# Patient Record
Sex: Female | Born: 1980
Health system: Southern US, Community
[De-identification: ages and names within clinical notes are randomized; demographics above are authoritative.]

## PROBLEM LIST (undated history)

## (undated) DIAGNOSIS — E119 Type 2 diabetes mellitus without complications: Secondary | ICD-10-CM

## (undated) DIAGNOSIS — E785 Hyperlipidemia, unspecified: Secondary | ICD-10-CM

## (undated) DIAGNOSIS — O24419 Gestational diabetes mellitus in pregnancy, unspecified control: Secondary | ICD-10-CM

## (undated) HISTORY — PX: OTHER SURGICAL HISTORY: SHX169

## (undated) HISTORY — DX: Hyperlipidemia, unspecified: E78.5

## (undated) HISTORY — PX: NO PAST SURGERIES: SHX2092

## (undated) HISTORY — DX: Gestational diabetes mellitus in pregnancy, unspecified control: O24.419

## (undated) HISTORY — DX: Type 2 diabetes mellitus without complications: E11.9

---

## 2001-03-14 ENCOUNTER — Emergency Department (HOSPITAL_COMMUNITY): Admission: EM | Admit: 2001-03-14 | Discharge: 2001-03-15 | Payer: Self-pay | Admitting: Emergency Medicine

## 2001-07-18 ENCOUNTER — Ambulatory Visit (HOSPITAL_COMMUNITY): Admission: RE | Admit: 2001-07-18 | Discharge: 2001-07-18 | Payer: Self-pay | Admitting: Obstetrics

## 2001-07-18 ENCOUNTER — Encounter: Payer: Self-pay | Admitting: Obstetrics

## 2001-10-27 ENCOUNTER — Inpatient Hospital Stay (HOSPITAL_COMMUNITY): Admission: AD | Admit: 2001-10-27 | Discharge: 2001-10-29 | Payer: Self-pay | Admitting: Obstetrics

## 2001-10-27 ENCOUNTER — Encounter (INDEPENDENT_AMBULATORY_CARE_PROVIDER_SITE_OTHER): Payer: Self-pay

## 2003-03-04 ENCOUNTER — Encounter: Payer: Self-pay | Admitting: Infectious Diseases

## 2003-03-04 ENCOUNTER — Ambulatory Visit (HOSPITAL_COMMUNITY): Admission: RE | Admit: 2003-03-04 | Discharge: 2003-03-04 | Payer: Self-pay | Admitting: Infectious Diseases

## 2003-04-16 ENCOUNTER — Emergency Department (HOSPITAL_COMMUNITY): Admission: EM | Admit: 2003-04-16 | Discharge: 2003-04-16 | Payer: Self-pay | Admitting: Emergency Medicine

## 2003-04-19 ENCOUNTER — Emergency Department (HOSPITAL_COMMUNITY): Admission: EM | Admit: 2003-04-19 | Discharge: 2003-04-19 | Payer: Self-pay | Admitting: Emergency Medicine

## 2004-02-22 ENCOUNTER — Other Ambulatory Visit: Admission: RE | Admit: 2004-02-22 | Discharge: 2004-02-22 | Payer: Self-pay | Admitting: Obstetrics and Gynecology

## 2005-02-20 ENCOUNTER — Inpatient Hospital Stay (HOSPITAL_COMMUNITY): Admission: AD | Admit: 2005-02-20 | Discharge: 2005-02-21 | Payer: Self-pay | Admitting: Obstetrics and Gynecology

## 2005-12-11 ENCOUNTER — Emergency Department (HOSPITAL_COMMUNITY): Admission: EM | Admit: 2005-12-11 | Discharge: 2005-12-11 | Payer: Self-pay | Admitting: Family Medicine

## 2007-11-14 ENCOUNTER — Emergency Department (HOSPITAL_COMMUNITY): Admission: EM | Admit: 2007-11-14 | Discharge: 2007-11-14 | Payer: Self-pay | Admitting: Emergency Medicine

## 2007-12-04 ENCOUNTER — Encounter: Admission: RE | Admit: 2007-12-04 | Discharge: 2008-03-03 | Payer: Self-pay | Admitting: Family Medicine

## 2007-12-12 ENCOUNTER — Ambulatory Visit (HOSPITAL_COMMUNITY): Admission: RE | Admit: 2007-12-12 | Discharge: 2007-12-12 | Payer: Self-pay | Admitting: Family Medicine

## 2008-04-03 ENCOUNTER — Emergency Department (HOSPITAL_COMMUNITY): Admission: EM | Admit: 2008-04-03 | Discharge: 2008-04-03 | Payer: Self-pay | Admitting: Family Medicine

## 2008-06-26 ENCOUNTER — Ambulatory Visit (HOSPITAL_COMMUNITY): Admission: RE | Admit: 2008-06-26 | Discharge: 2008-06-26 | Payer: Self-pay | Admitting: Obstetrics

## 2008-06-26 ENCOUNTER — Encounter (INDEPENDENT_AMBULATORY_CARE_PROVIDER_SITE_OTHER): Payer: Self-pay | Admitting: Obstetrics

## 2009-01-20 ENCOUNTER — Encounter: Admission: RE | Admit: 2009-01-20 | Discharge: 2009-01-20 | Payer: Self-pay | Admitting: Obstetrics

## 2009-03-24 ENCOUNTER — Inpatient Hospital Stay (HOSPITAL_COMMUNITY): Admission: RE | Admit: 2009-03-24 | Discharge: 2009-03-25 | Payer: Self-pay | Admitting: Obstetrics

## 2009-03-24 ENCOUNTER — Encounter (INDEPENDENT_AMBULATORY_CARE_PROVIDER_SITE_OTHER): Payer: Self-pay | Admitting: Obstetrics

## 2009-11-15 ENCOUNTER — Emergency Department (HOSPITAL_COMMUNITY): Admission: EM | Admit: 2009-11-15 | Discharge: 2009-11-15 | Payer: Self-pay | Admitting: Family Medicine

## 2010-10-30 ENCOUNTER — Encounter: Payer: Self-pay | Admitting: Family Medicine

## 2010-12-11 ENCOUNTER — Inpatient Hospital Stay (HOSPITAL_COMMUNITY)
Admission: RE | Admit: 2010-12-11 | Discharge: 2010-12-11 | Disposition: A | Discharge status: Home / Self Care | Payer: 59 | Source: Ambulatory Visit | Attending: Obstetrics & Gynecology | Admitting: Obstetrics & Gynecology

## 2010-12-11 DIAGNOSIS — O4700 False labor before 37 completed weeks of gestation, unspecified trimester: Secondary | ICD-10-CM | POA: Insufficient documentation

## 2010-12-11 LAB — URINE MICROSCOPIC-ADD ON

## 2010-12-11 LAB — URINALYSIS, ROUTINE W REFLEX MICROSCOPIC
Ketones, ur: NEGATIVE mg/dL
Nitrite: NEGATIVE
Protein, ur: NEGATIVE mg/dL
pH: 7 (ref 5.0–8.0)

## 2010-12-13 LAB — URINE CULTURE: Culture  Setup Time: 201203041533

## 2011-01-16 LAB — GLUCOSE, CAPILLARY: Glucose-Capillary: 97 mg/dL (ref 70–99)

## 2011-01-16 LAB — CBC
MCHC: 34.1 g/dL (ref 30.0–36.0)
MCHC: 34.2 g/dL (ref 30.0–36.0)
MCV: 84.2 fL (ref 78.0–100.0)
MCV: 84.5 fL (ref 78.0–100.0)
Platelets: 125 10*3/uL — ABNORMAL LOW (ref 150–400)
Platelets: 135 10*3/uL — ABNORMAL LOW (ref 150–400)
RDW: 14.3 % (ref 11.5–15.5)

## 2011-01-16 LAB — GLUCOSE, RANDOM: Glucose, Bld: 77 mg/dL (ref 70–99)

## 2011-02-14 ENCOUNTER — Inpatient Hospital Stay (HOSPITAL_COMMUNITY)
Admission: AD | Admit: 2011-02-14 | Discharge: 2011-02-16 | DRG: 775 | Disposition: A | Discharge status: Home / Self Care | Payer: 59 | Source: Ambulatory Visit | Attending: Obstetrics and Gynecology | Admitting: Obstetrics and Gynecology

## 2011-02-14 DIAGNOSIS — D62 Acute posthemorrhagic anemia: Secondary | ICD-10-CM | POA: Diagnosis not present

## 2011-02-14 DIAGNOSIS — D689 Coagulation defect, unspecified: Principal | ICD-10-CM | POA: Diagnosis present

## 2011-02-14 DIAGNOSIS — O9903 Anemia complicating the puerperium: Secondary | ICD-10-CM | POA: Diagnosis not present

## 2011-02-14 DIAGNOSIS — D696 Thrombocytopenia, unspecified: Secondary | ICD-10-CM | POA: Diagnosis present

## 2011-02-14 LAB — CBC
MCH: 26.9 pg (ref 26.0–34.0)
MCHC: 34.3 g/dL (ref 30.0–36.0)
Platelets: 137 10*3/uL — ABNORMAL LOW (ref 150–400)
RDW: 14.5 % (ref 11.5–15.5)

## 2011-02-15 LAB — CBC
HCT: 32.5 % — ABNORMAL LOW (ref 36.0–46.0)
MCHC: 33.2 g/dL (ref 30.0–36.0)
RDW: 14.6 % (ref 11.5–15.5)

## 2011-02-15 LAB — RPR: RPR Ser Ql: NONREACTIVE

## 2011-02-16 ENCOUNTER — Inpatient Hospital Stay (HOSPITAL_COMMUNITY): Admission: AD | Admit: 2011-02-16 | Payer: Self-pay | Admitting: Obstetrics

## 2011-02-16 LAB — CBC
MCH: 26.1 pg (ref 26.0–34.0)
Platelets: 145 10*3/uL — ABNORMAL LOW (ref 150–400)
RBC: 3.22 MIL/uL — ABNORMAL LOW (ref 3.87–5.11)
WBC: 12.5 10*3/uL — ABNORMAL HIGH (ref 4.0–10.5)

## 2011-02-17 NOTE — H&P (Signed)
  NAMEMATTELYN, IMHOFF                    ACCOUNT NO.:  [id number]  MEDICAL RECORD NO.:  [id number]           PATIENT TYPE:  I  LOCATION:  9141                          FACILITY:  [facility]  PHYSICIAN:  Lenoard Aden, M.D.DATE OF BIRTH:  [birth year]  DATE OF ADMISSION:  02/14/2011 DATE OF DISCHARGE:                             HISTORY & PHYSICAL   CHIEF COMPLAINT:  Labor.  She is a 30 year old Asian female G6, P 3-0-2-3 at 39-6/7 weeks presents in labor.  She has a history of precipitous delivery.  She has prenatal course to include an elevated 1-hour glucose tolerance test with a normal 3-hour, history of hemoglobin E variant, mild gestational thrombocytopenia and threatened preterm labor.  She is GBS negative.  She has a past medical history remarkable for gastroesophageal reflux. Her medications are prenatal vitamins.  She has no known drug allergies.  SOCIAL HISTORY:  Noncontributory.  She has a history of SVD x3 and D and C x2.  PHYSICAL EXAMINATION:  GENERAL:  She is a well-developed, well-nourished Asian female in no acute distress. HEENT: Normal. NECK:  Supple.  Full range of motion. LUNGS:  Clear. HEART:  Regular rhythm. ABDOMEN:  Soft, gravid, nontender.  Estimated fetal weight 6-1/2 pounds. PELVIC:  Cervix is 5 cm, 90% vertex, 0 station. EXTREMITIES:  There are no cords. NEUROLOGIC:  Nonfocal. SKIN:  Intact.  NST is reactive.  IMPRESSION:  Term intrauterine pregnancy in active labor.  PLAN:  Anticipate attempts at vaginal delivery.     Lenoard Aden, M.D.     RJT/MEDQ  D:  02/14/2011  T:  02/15/2011  Job:  [id number]  Electronically Signed by Olivia Mackie M.D. on 02/17/2011 11:03:53 AM

## 2011-02-21 NOTE — Op Note (Signed)
[name], Carol Harrington                    ACCOUNT NO.:  [id number]   MEDICAL RECORD NO.:  [id number]          PATIENT TYPE:  AMB   LOCATION:  SDC                           FACILITY:  [facility]   PHYSICIAN:  Lendon Colonel, MD   DATE OF BIRTH:  1981-03-01   DATE OF PROCEDURE:  06/26/2008  DATE OF DISCHARGE:                               OPERATIVE REPORT   PREOPERATIVE DIAGNOSIS:  A 9-week missed abortion.   POSTOPERATIVE DIAGNOSIS:  A 9-week missed abortion.   PROCEDURE:  Suction dilation and curettage.   ANESTHESIA:  MAC with local.   COMPLICATIONS:  None.   FINDINGS:  A 9-week size nontender uterus and good hemostasis  postprocedure.   ESTIMATED BLOOD LOSS:  Minimal.   SPECIMEN:  Pathology.   ANTIBIOTICS:  100 mg of IV doxycycline.   PROCEDURE:  After informed consent was obtained from the patient and  options of expectant management and Misoprostol were discussed with the  patient.  She decided to perform a suction dilation and curettage.  The  patient was taken to the operating room, prepped and draped in the  normal sterile fashion in dorsal supine lithotomy position.  No  catheterization was performed.  Bimanual examination was done to assess  the size and position of the uterus.  Two mL of 1% lidocaine were  infused at 12 o'clock into the cervix.after sterile speculum was  inserted into the vagina for adequate visualization. A tenaculum was  then used to grasp the ant lip of the cervix.  Ten mL of 1% lidocaine  was infused into the paracervical junction at 5 o'clock and 7 o'clock,  then the uterus was easily dilated to a #31 Pratt dilator. A #10 French  suction curette was easily advanced through the internal os.  With 2  passes and suction applied, removal of contents of the uterine cavity  was accomplished.  A very gentle sharp curettage was done to assess all  four quadrants of the uterus and a gritty cry was noted.  The 10-French  suction cannula was reintroduced into the  uterus, again a gritty cry was  noted and no additional POCs or blood was removed.  Excellent hemostasis  was noted.  Bimanual examination performed revealed still slightly  enlarge uterus, but no bogginess or blood loss.  Given that the amount  of POCs was slightly less than expected, attempt was made to examine the  tissue. The catch basin from the suction device was opened up, irrigated  out and the tissue was floated in normal saline.  Moderate amount of  placental tissue and fetal sac were noted.  No appreciable fetal parts  were seen though this is not unusual given the early gestational age.  Procedure was then terminated after repeat speculum examination  confirmed no additional uterine bleeding.  The patient was awoken from  anesthesia, having tolerated the procedure well.  Sponge, lap, and  needle counts were correct x3.  The patient was taken to the recovery  room in a stable condition with plans for single dose of postprocedure  doxycycline  tomorrow and 2 days of po methergine.      Lendon Colonel, MD  Electronically Signed     KAF/MEDQ  D:  06/26/2008  T:  06/27/2008  Job:  [id number]

## 2011-07-06 LAB — CBC
HCT: 42.4
MCV: 81.3
Platelets: 159
RDW: 13.3

## 2011-07-06 LAB — DIFFERENTIAL
Basophils Absolute: 0
Basophils Relative: 1
Eosinophils Absolute: 0.1
Eosinophils Relative: 1
Lymphocytes Relative: 33

## 2011-07-06 LAB — POCT I-STAT, CHEM 8
HCT: 44
Hemoglobin: 15
Sodium: 139
TCO2: 23

## 2011-07-06 LAB — STREP A DNA PROBE

## 2011-07-06 LAB — RAPID STREP SCREEN (MED CTR MEBANE ONLY): Streptococcus, Group A Screen (Direct): NEGATIVE

## 2011-07-10 LAB — URINALYSIS, ROUTINE W REFLEX MICROSCOPIC
Bilirubin Urine: NEGATIVE
Ketones, ur: NEGATIVE
Nitrite: NEGATIVE
Protein, ur: NEGATIVE

## 2011-07-10 LAB — CBC
Platelets: 148 — ABNORMAL LOW
WBC: 7.2

## 2011-07-10 LAB — TYPE AND SCREEN: ABO/RH(D): O POS

## 2012-02-06 ENCOUNTER — Encounter (HOSPITAL_COMMUNITY): Payer: Self-pay | Admitting: Emergency Medicine

## 2012-02-06 ENCOUNTER — Emergency Department (HOSPITAL_COMMUNITY)
Admission: EM | Admit: 2012-02-06 | Discharge: 2012-02-06 | Disposition: A | Discharge status: Home / Self Care | Payer: 59 | Source: Home / Self Care | Attending: Emergency Medicine | Admitting: Emergency Medicine

## 2012-02-06 DIAGNOSIS — B085 Enteroviral vesicular pharyngitis: Secondary | ICD-10-CM

## 2012-02-06 MED ORDER — ALUM & MAG HYDROXIDE-SIMETH 200-200-20 MG/5ML PO SUSP: 5.0000 mL | Freq: Four times a day (QID) | ORAL | Status: AC | PRN

## 2012-02-06 MED ORDER — DIPHENHYDRAMINE HCL 50 MG/ML IJ SOLN
INTRAMUSCULAR | Status: AC
  Filled 2012-02-06: qty 1

## 2012-02-06 MED ORDER — LIDOCAINE VISCOUS 2 % MT SOLN: 10.0000 mL | Freq: Three times a day (TID) | OROMUCOSAL | Status: AC | PRN

## 2012-02-06 MED ORDER — IBUPROFEN 600 MG PO TABS: 600.0000 mg | ORAL_TABLET | Freq: Four times a day (QID) | ORAL | Status: AC | PRN

## 2012-02-06 MED ORDER — DIPHENHYDRAMINE HCL 12.5 MG/5ML PO LIQD: 12.5000 mg | Freq: Four times a day (QID) | ORAL | Status: DC | PRN

## 2012-02-06 NOTE — ED Notes (Signed)
Pt. Stated, I've had a cold with a HA with dizziness for a week.

## 2012-02-06 NOTE — Discharge Instructions (Signed)
Mix the benadryl and mylanta in a 1:1 ratio and then apply it to the sores in your mouth. You may swallow this twice a day. Tylenol and motrin as needed for pain. Make sure you drink plenty of electrolyte containing fluids.  Return for persistent fevers >100.4, if you get worse, are unable to swallow, or for any concerns.

## 2012-02-07 NOTE — ED Provider Notes (Signed)
History     CSN: [id number]  Arrival date & time 02/06/12  0102   First MD Initiated Contact with Patient 02/06/12 1937      Chief Complaint  Patient presents with  . URI    (Consider location/radiation/quality/duration/timing/severity/associated sxs/prior treatment) HPI Comments: Patient reports left-sided sore throat, ear pain, tender, sore swollen left-sided lymphadenopathy, oral ulcers for the past week. Reports pain with swallowing, and decreased by mouth intake. No voice changes, sensation of throat swelling shut, drooling, trismus. Reports fevers Tmax 101 beginning the illness, none in several days. Complains of intermittent, diffuse headache, and some lightheadedness when she goes from sitting to standing rapidly. No photophobia, neck stiffness, rash, vertigo, other dizziness. No otorrhea, change in hearing. No palpitations, presyncope, syncope. No blisters on her hands or feet.No known sick contacts. Patient is not a smoker, and is otherwise healthy. No history of hSV.  ROS as noted in HPI. All other ROS negative.   Patient is a 31 y.o. female presenting with URI. The history is provided by the patient. No language interpreter was used.  URI The primary symptoms include sore throat. The current episode started 3 to 5 days ago. This is a new problem. The problem has not changed since onset.   History reviewed. No pertinent past medical history.  History reviewed. No pertinent past surgical history.  History reviewed. No pertinent family history.  History  Substance Use Topics  . Smoking status: Never Smoker   . Smokeless tobacco: Not on file  . Alcohol Use: No    OB History    Grav Para Term Preterm Abortions TAB SAB Ect Mult Living                  Review of Systems  HENT: Positive for sore throat.     Allergies  Review of patient's allergies indicates no known allergies.  Home Medications   Current Outpatient Rx  Name Route Sig Dispense Refill  . ALUM  & MAG HYDROXIDE-SIMETH 200-200-20 MG/5ML PO SUSP Oral Take 5 mLs by mouth 4 (four) times daily as needed for indigestion. Mix 5 mL with 5 mL of benadryl. Take 4-6 hr prn pain 150 mL 0  . DIPHENHYDRAMINE HCL 12.5 MG/5ML PO LIQD Oral Take 5 mLs (12.5 mg total) by mouth 4 (four) times daily as needed for allergies. Mix 5 mL with 5 mL of Maalox. Hold in mouth and swallow. Take the 10 mL q 4-6 hr prn pain 120 mL 0  . IBUPROFEN 600 MG PO TABS Oral Take 1 tablet (600 mg total) by mouth every 6 (six) hours as needed for pain. 30 tablet 0  . LIDOCAINE VISCOUS 2 % MT SOLN Oral Take 10 mLs by mouth 3 (three) times daily as needed for pain. Swish and spit. Do not swallow. 100 mL 0    BP 122/82  Pulse 78  Temp(Src) 98.5 F (36.9 C) (Oral)  Resp 16  SpO2 100%  Physical Exam  Nursing note and vitals reviewed. Constitutional: She is oriented to person, place, and time. She appears well-developed and well-nourished. No distress.  HENT:  Head: Normocephalic and atraumatic. No trismus in the jaw.  Right Ear: Tympanic membrane normal.  Left Ear: Tympanic membrane normal.  Nose: Nose normal. Right sinus exhibits no maxillary sinus tenderness and no frontal sinus tenderness. Left sinus exhibits no maxillary sinus tenderness and no frontal sinus tenderness.  Mouth/Throat: Uvula is midline. Oral lesions present. No tonsillar abscesses.       Erythematous  left TM. Sharp light reflex, no bulging, retraction, effusion. Aphthous ulcers on the inner mucosa of lips, and on left tonsil. No facial rash.  Eyes: Conjunctivae and EOM are normal. Pupils are equal, round, and reactive to light.  Neck: Normal range of motion and full passive range of motion without pain. Neck supple. No Brudzinski's sign and no Kernig's sign noted.  Cardiovascular: Normal rate, regular rhythm and normal heart sounds.   Pulmonary/Chest: Effort normal and breath sounds normal.  Abdominal: Soft. Bowel sounds are normal. She exhibits no  distension.  Musculoskeletal: Normal range of motion.  Lymphadenopathy:    She has cervical adenopathy.  Neurological: She is alert and oriented to person, place, and time. She has normal strength. No cranial nerve deficit or sensory deficit. Coordination and gait normal.  Skin: Skin is warm and dry. No rash noted.  Psychiatric: She has a normal mood and affect. Her behavior is normal. Judgment and thought content normal.    ED Course  Procedures (including critical care time)  Labs Reviewed - No data to display No results found.   1. Herpangina       MDM  Home with Maalox, Benadryl 1-1 mixture, viscous lidocaine, NSAIDs. No signs of sinusitis, otitis. Feel that the headache is from mild dehydration. No signs of meningitis, or other serious cause of her headache at this time The dizziness is primarily when going from sitting to standing rapidly. States she's not been eating and drinking as much because of her pain. Advised increased fluid intake. Will refer her to her primary care physician for ongoing medical care. Patient agrees with plan.  Luiz Blare, MD 02/07/12 [phone]

## 2012-10-29 ENCOUNTER — Encounter (HOSPITAL_COMMUNITY): Payer: Self-pay | Admitting: *Deleted

## 2012-10-29 ENCOUNTER — Emergency Department (HOSPITAL_COMMUNITY)
Admission: EM | Admit: 2012-10-29 | Discharge: 2012-10-29 | Disposition: A | Discharge status: Home / Self Care | Payer: Medicaid Other | Source: Home / Self Care | Attending: Emergency Medicine | Admitting: Emergency Medicine

## 2012-10-29 DIAGNOSIS — J020 Streptococcal pharyngitis: Secondary | ICD-10-CM

## 2012-10-29 LAB — POCT RAPID STREP A: Streptococcus, Group A Screen (Direct): POSITIVE — AB

## 2012-10-29 MED ORDER — FLUTICASONE PROPIONATE 50 MCG/ACT NA SUSP: 2.0000 | Freq: Every day | NASAL | Status: DC

## 2012-10-29 MED ORDER — PENICILLIN V POTASSIUM 500 MG PO TABS: 500.0000 mg | ORAL_TABLET | Freq: Four times a day (QID) | ORAL | Status: DC

## 2012-10-29 NOTE — ED Notes (Signed)
Pt is here with complaints of 3 day history of sore throat a fever.  Pt denies cough.  Also reports nasal congestion and nausea.  White patches noted on tongue.

## 2012-10-29 NOTE — ED Provider Notes (Signed)
Chief Complaint  Patient presents with  . Fever  . Sore Throat    History of Present Illness:   Carol Harrington  is a 32 year old [place] female who has had a four-day history of sore throat, fever, chills, headache, nasal congestion, abdominal pain. She denies any cough, nausea, or vomiting. She has no known exposure to strep, mono, or influenza.  Review of Systems:  Other than noted above, the patient denies any of the following symptoms. Systemic:  No fever, chills, sweats, fatigue, myalgias, headache, or anorexia. Eye:  No redness, pain or drainage. ENT:  No earache, ear congestion, nasal congestion, sneezing, rhinorrhea, sinus pressure, sinus pain, post nasal drip, or sore throat. Lungs:  No cough, sputum production, wheezing, shortness of breath, or chest pain. GI:  No abdominal pain, nausea, vomiting, or diarrhea.  PMFSH:  Past medical history, family history, social history, meds, and allergies were reviewed.  Physical Exam:   Vital signs:  BP 156/79  Pulse 103  Temp 100.4 F (38 C) (Oral)  Resp 16  Ht 4\' 11"  (1.499 m)  Wt 115 lb (52.164 kg)  BMI 23.23 kg/m2  SpO2 100% General:  Alert, in no distress. Eye:  No conjunctival injection or drainage. Lids were normal. ENT:  TMs and canals were normal, without erythema or inflammation.  Nasal mucosa was clear and uncongested, without drainage.  Mucous membranes were moist.  Pharynx was clear, without exudate or drainage.  There were no oral ulcerations or lesions. Neck:  Supple, no adenopathy, tenderness or mass. Lungs:  No respiratory distress.  Lungs were clear to auscultation, without wheezes, rales or rhonchi.  Breath sounds were clear and equal bilaterally.  Heart:  Regular rhythm, without gallops, murmers or rubs. Skin:  Clear, warm, and dry, without rash or lesions.  Labs:   Results for orders placed during the hospital encounter of 10/29/12  POCT RAPID STREP A ([facility] ONLY)      Component Value Range   Streptococcus,  Group A Screen (Direct) POSITIVE (*) NEGATIVE   Assessment:  The encounter diagnosis was Strep throat.  Plan:   1.  The following meds were prescribed:   New Prescriptions   FLUTICASONE (FLONASE) 50 MCG/ACT NASAL SPRAY    Place 2 sprays into the nose daily.   PENICILLIN V POTASSIUM (VEETID) 500 MG TABLET    Take 1 tablet (500 mg total) by mouth 4 (four) times daily.   2.  The patient was instructed in symptomatic care and handouts were given. 3.  The patient was told to return if becoming worse in any way, if no better in 3 or 4 days, and given some red flag symptoms that would indicate earlier return.   Reuben Likes, MD 10/29/12 [phone]

## 2015-04-11 ENCOUNTER — Encounter (HOSPITAL_COMMUNITY): Payer: Self-pay | Admitting: Emergency Medicine

## 2015-04-11 ENCOUNTER — Emergency Department (HOSPITAL_COMMUNITY)
Admission: EM | Admit: 2015-04-11 | Discharge: 2015-04-11 | Disposition: A | Discharge status: Home / Self Care | Payer: Medicaid Other | Source: Home / Self Care | Attending: Emergency Medicine | Admitting: Emergency Medicine

## 2015-04-11 DIAGNOSIS — J324 Chronic pansinusitis: Secondary | ICD-10-CM

## 2015-04-11 MED ORDER — PREDNISONE 50 MG PO TABS: ORAL_TABLET | ORAL | Status: DC

## 2015-04-11 MED ORDER — CETIRIZINE HCL 10 MG PO TABS: 10.0000 mg | ORAL_TABLET | Freq: Every day | ORAL | Status: DC

## 2015-04-11 MED ORDER — FLUTICASONE PROPIONATE 50 MCG/ACT NA SUSP: 2.0000 | Freq: Every day | NASAL | Status: DC

## 2015-04-11 MED ORDER — AMOXICILLIN-POT CLAVULANATE 875-125 MG PO TABS: 1.0000 | ORAL_TABLET | Freq: Two times a day (BID) | ORAL | Status: DC

## 2015-04-11 NOTE — ED Provider Notes (Signed)
CSN: [id number]     Arrival date & time 04/11/15  1729 History   First MD Initiated Contact with Patient 04/11/15 1816     Chief Complaint  Patient presents with  . Otalgia   (Consider location/radiation/quality/duration/timing/severity/associated sxs/prior Treatment) HPI  She is a 34 year old woman here for evaluation of ear pain. She states for the last year she has had ear pain, nasal congestion, sinus pressure. This will, and go to some extent. This started after a trip back to [place]. She denies any fevers or chills. No cough. She will occasionally get a slight sore throat. No nausea or vomiting. She states she will occasionally get a little dizzy. This is worse when she goes from leaning forward to straight up. She states Tylenol will relieve the pain for a few days to weeks.  History reviewed. No pertinent past medical history. History reviewed. No pertinent past surgical history. History reviewed. No pertinent family history. History  Substance Use Topics  . Smoking status: Never Smoker   . Smokeless tobacco: Not on file  . Alcohol Use: No   OB History    No data available     Review of Systems As in history of present illness Allergies  Review of patient's allergies indicates no known allergies.  Home Medications   Prior to Admission medications   Medication Sig Start Date End Date Taking? Authorizing Provider  amoxicillin-clavulanate (AUGMENTIN) 875-125 MG per tablet Take 1 tablet by mouth 2 (two) times daily. 04/11/15   Melony Overly, MD  cetirizine (ZYRTEC) 10 MG tablet Take 1 tablet (10 mg total) by mouth daily. 04/11/15   Melony Overly, MD  fluticasone (FLONASE) 50 MCG/ACT nasal spray Place 2 sprays into both nostrils daily. 04/11/15   Melony Overly, MD  predniSONE (DELTASONE) 50 MG tablet Take 1 pill daily for 5 days. 04/11/15   Melony Overly, MD   BP 131/86 mmHg  Pulse 76  Temp(Src) 98.1 F (36.7 C) (Oral)  Resp 14  SpO2 98% Physical Exam  Constitutional: She is  oriented to person, place, and time. She appears well-developed and well-nourished. No distress.  HENT:  Mouth/Throat: Oropharynx is clear and moist. No oropharyngeal exudate.  Nasal mucosa is erythematous and slightly swollen. TMs are normal bilaterally. She has mild sinus tenderness.  Eyes: Conjunctivae are normal.  Cardiovascular: Normal rate, regular rhythm and normal heart sounds.   No murmur heard. Pulmonary/Chest: Effort normal and breath sounds normal. No respiratory distress. She has no wheezes. She has no rales.  Lymphadenopathy:    She has no cervical adenopathy.  Neurological: She is alert and oriented to person, place, and time.    ED Course  Procedures (including critical care time) Labs Review Labs Reviewed - No data to display  Imaging Review No results found.   MDM   1. Chronic pansinusitis    Will treat with prednisone and Augmentin. Zyrtec and Flonase as well. Follow-up if no improvement in 2 weeks.    Melony Overly, MD 04/11/15 [phone]

## 2015-04-11 NOTE — Discharge Instructions (Signed)
Your symptoms are coming from inflammation and infection of your sinuses. Take prednisone 1 pill daily for 5 days. Take Augmentin 1 pill twice a day for 10 days. Use Flonase daily for the next 2 weeks. Take Zyrtec daily for the next 2 weeks. If no improvement after 2 weeks, please come back.

## 2015-04-11 NOTE — ED Notes (Signed)
Pt states that she has had ear pain for at least a year.

## 2015-06-22 ENCOUNTER — Emergency Department (HOSPITAL_COMMUNITY)
Admission: EM | Admit: 2015-06-22 | Discharge: 2015-06-22 | Disposition: A | Discharge status: Home / Self Care | Payer: Medicaid Other | Source: Home / Self Care | Attending: Emergency Medicine | Admitting: Emergency Medicine

## 2015-06-22 ENCOUNTER — Encounter (HOSPITAL_COMMUNITY): Payer: Self-pay | Admitting: Emergency Medicine

## 2015-06-22 DIAGNOSIS — A059 Bacterial foodborne intoxication, unspecified: Secondary | ICD-10-CM | POA: Diagnosis not present

## 2015-06-22 DIAGNOSIS — R197 Diarrhea, unspecified: Secondary | ICD-10-CM | POA: Diagnosis not present

## 2015-06-22 NOTE — ED Notes (Signed)
Pt has been suffering from diarrhea and abdominal cramping since Sunday night.  Pt reports eating chicken wings and pork that afternoon.  She denies any vomiting and no fever.

## 2015-06-22 NOTE — ED Provider Notes (Signed)
CSN: [id number]     Arrival date & time 06/22/15  1758 History   First MD Initiated Contact with Patient 06/22/15 1847     Chief Complaint  Patient presents with  . Diarrhea  . Abdominal Cramping   (Consider location/radiation/quality/duration/timing/severity/associated sxs/prior Treatment) HPI  She is a 34 year old woman here for evaluation of abdominal pain and diarrhea.  She states she went to a cookout on Sunday and had chicken, pork, and beef. About an hour after eating, she developed watery diarrhea. This has persisted. She states she has to go the bathroom anytime she eats or drinks anything. She also reports diffuse crampy abdominal pain. No nausea or vomiting. No fevers. No blood in the stool.  History reviewed. No pertinent past medical history. History reviewed. No pertinent past surgical history. History reviewed. No pertinent family history. Social History  Substance Use Topics  . Smoking status: Never Smoker   . Smokeless tobacco: None  . Alcohol Use: No   OB History    No data available     Review of Systems As in history of present illness Allergies  Review of patient's allergies indicates no known allergies.  Home Medications   Prior to Admission medications   Not on File   Meds Ordered and Administered this Visit  Medications - No data to display  BP 117/75 mmHg  Pulse 93  Temp(Src) 98.5 F (36.9 C) (Oral)  Resp 16  SpO2 100% No data found.   Physical Exam  Constitutional: She is oriented to person, place, and time. She appears well-developed and well-nourished. No distress.  Cardiovascular: Normal rate, regular rhythm and normal heart sounds.   No murmur heard. Pulmonary/Chest: Effort normal and breath sounds normal. No respiratory distress. She has no wheezes. She has no rales.  Abdominal: Soft. She exhibits no distension and no mass. There is tenderness (mild diffuse). There is no rebound and no guarding.  Increased bowel sounds   Neurological: She is alert and oriented to person, place, and time.    ED Course  Procedures (including critical care time)  Labs Review Labs Reviewed - No data to display  Imaging Review No results found.     MDM   1. Food poisoning   2. Diarrhea    Discussed that this should gradually improve over the next few days. Okay to use over-the-counter Imodium as needed. Return precautions reviewed.    Melony Overly, MD 06/22/15 [phone]

## 2015-06-22 NOTE — Discharge Instructions (Signed)
You have food poisoning. The diarrhea should resolve in the next few days. You can take over-the-counter Imodium 3 times a day as needed for diarrhea. A few to see blood in your stool or are not getting better in the next 3-4 days, please come back.

## 2015-09-18 ENCOUNTER — Emergency Department (INDEPENDENT_AMBULATORY_CARE_PROVIDER_SITE_OTHER)
Admission: EM | Admit: 2015-09-18 | Discharge: 2015-09-18 | Disposition: A | Discharge status: Home / Self Care | Payer: Medicaid Other | Source: Home / Self Care | Attending: Family Medicine | Admitting: Family Medicine

## 2015-09-18 ENCOUNTER — Encounter (HOSPITAL_COMMUNITY): Payer: Self-pay | Admitting: *Deleted

## 2015-09-18 DIAGNOSIS — J0101 Acute recurrent maxillary sinusitis: Secondary | ICD-10-CM | POA: Diagnosis not present

## 2015-09-18 MED ORDER — IPRATROPIUM BROMIDE 0.06 % NA SOLN: 2.0000 | Freq: Four times a day (QID) | NASAL | Status: DC

## 2015-09-18 MED ORDER — AMOXICILLIN 500 MG PO CAPS: 500.0000 mg | ORAL_CAPSULE | Freq: Three times a day (TID) | ORAL | Status: DC

## 2015-09-18 NOTE — ED Provider Notes (Signed)
CSN: [id number]     Arrival date & time 09/18/15  1848 History   First MD Initiated Contact with Patient 09/18/15 1914     Chief Complaint  Patient presents with  . Otalgia   (Consider location/radiation/quality/duration/timing/severity/associated sxs/prior Treatment) Patient is a 34 y.o. female presenting with ear pain. The history is provided by the patient.  Otalgia Location:  Bilateral Behind ear:  No abnormality Quality:  Throbbing and pressure Severity:  Mild Onset quality:  Gradual Duration:  2 weeks Progression:  Unchanged Chronicity:  New Associated symptoms: congestion and rhinorrhea   Associated symptoms: no ear discharge, no fever and no hearing loss     History reviewed. No pertinent past medical history. History reviewed. No pertinent past surgical history. No family history on file. Social History  Substance Use Topics  . Smoking status: Never Smoker   . Smokeless tobacco: None  . Alcohol Use: No   OB History    No data available     Review of Systems  Constitutional: Negative.  Negative for fever.  HENT: Positive for congestion, ear pain, postnasal drip and rhinorrhea. Negative for ear discharge and hearing loss.   Respiratory: Negative.   Cardiovascular: Negative.   All other systems reviewed and are negative.   Allergies  Review of patient's allergies indicates no known allergies.  Home Medications   Prior to Admission medications   Medication Sig Start Date End Date Taking? Authorizing Provider  amoxicillin (AMOXIL) 500 MG capsule Take 1 capsule (500 mg total) by mouth 3 (three) times daily. 09/18/15   Billy Fischer, MD  ipratropium (ATROVENT) 0.06 % nasal spray Place 2 sprays into the nose 4 (four) times daily. 09/18/15   Billy Fischer, MD   Meds Ordered and Administered this Visit  Medications - No data to display  There were no vitals taken for this visit. No data found.   Physical Exam  Constitutional: She is oriented to person,  place, and time. She appears well-developed and well-nourished. No distress.  HENT:  Head: Normocephalic.  Right Ear: External ear normal.  Left Ear: External ear normal.  Mouth/Throat: Oropharynx is clear and moist.  Neck: Normal range of motion. Neck supple.  Pulmonary/Chest: Effort normal and breath sounds normal.  Lymphadenopathy:    She has no cervical adenopathy.  Neurological: She is alert and oriented to person, place, and time.  Skin: Skin is warm and dry.  Nursing note and vitals reviewed.   ED Course  Procedures (including critical care time)  Labs Review Labs Reviewed - No data to display  Imaging Review No results found.   Visual Acuity Review  Right Eye Distance:   Left Eye Distance:   Bilateral Distance:    Right Eye Near:   Left Eye Near:    Bilateral Near:         MDM   1. Acute recurrent maxillary sinusitis        Billy Fischer, MD 09/18/15 [phone]

## 2015-09-18 NOTE — ED Notes (Signed)
Pt reports      Bilateral    r    And  l      Earache        Symptoms  X  2  Week            pt  Sitting     Upright   On  Exam table   In no  Acute  Distress

## 2016-02-16 ENCOUNTER — Ambulatory Visit: Payer: Medicaid Other | Admitting: Certified Nurse Midwife

## 2016-02-23 ENCOUNTER — Ambulatory Visit (INDEPENDENT_AMBULATORY_CARE_PROVIDER_SITE_OTHER): Payer: Medicaid Other | Admitting: Certified Nurse Midwife

## 2016-02-23 ENCOUNTER — Ambulatory Visit: Payer: Medicaid Other | Admitting: Certified Nurse Midwife

## 2016-02-23 ENCOUNTER — Encounter: Payer: Self-pay | Admitting: Certified Nurse Midwife

## 2016-02-23 VITALS — BP 100/64 | HR 79 | Wt 115.0 lb

## 2016-02-23 DIAGNOSIS — Z30430 Encounter for insertion of intrauterine contraceptive device: Secondary | ICD-10-CM

## 2016-02-23 DIAGNOSIS — Z01419 Encounter for gynecological examination (general) (routine) without abnormal findings: Secondary | ICD-10-CM

## 2016-02-23 DIAGNOSIS — N9489 Other specified conditions associated with female genital organs and menstrual cycle: Secondary | ICD-10-CM

## 2016-02-23 DIAGNOSIS — Z30014 Encounter for initial prescription of intrauterine contraceptive device: Secondary | ICD-10-CM

## 2016-02-23 DIAGNOSIS — Z30433 Encounter for removal and reinsertion of intrauterine contraceptive device: Secondary | ICD-10-CM | POA: Diagnosis not present

## 2016-02-23 MED ORDER — IBUPROFEN 800 MG PO TABS: 800.0000 mg | ORAL_TABLET | Freq: Three times a day (TID) | ORAL | Status: DC | PRN

## 2016-02-23 NOTE — Progress Notes (Signed)
Patient ID: Carol Harrington, female   DOB: [birth year], 35 y.o.   MRN: [id number]  [facility] REMOVAL   Reasons  for removal:  Due to be changed, has had it for 5 years.  Is not having periods.     A timeout was performed confirming the patient, the procedure and allergy status. The patient was placed in the lithotomy position, a speculum was placed.  Cervix and strings visualized.   Long forceps used in a strile manner.  Stings grasped with long forceps and device removed intact.  The patient tolerated the procedure well.  New contraceptive method: Mirena IUD, see note below.               IUD Procedure Note   DIAGNOSIS: Desires long-term, reversible contraception   PROCEDURE: IUD placement Performing Provider: Kandis Cocking CNM  Patient counseled prior to procedure. I explained risks and benefits of Mirena IUD, reviewed alternative forms of contraception. Patient stated understanding and consented to continue with procedure.   LMP: unknown, amenorrhea with Mirena IUD Pregnancy Test: not indicated Lot #: [id number] Expiration Date: 01/20   IUD type: [X]  Mirena   [   ] Paraguard  [   ] Isla Pence   [   ]  Kyleena  PROCEDURE:  Timeout procedure was performed to ensure right patient and right site.  A bimanual exam was performed to determine the position of the uterus, retroverted. The speculum was placed. The vagina and cervix was sterilized in the usual manner and sterile technique was maintained throughout the course of the procedure. A single toothed tenaculum was not required. The depth of the uterus was sounded to 9 cm. With gentle traction on the tenaculum, the IUD was inserted to the appropriate depth and inserted without difficulty.  The string was cut to an estimated 4 cm length. Bleeding was minimal. The patient tolerated the procedure well.   Follow up: The patient tolerated the procedure well without complications.  Standard post-procedure care is explained and return precautions are given.    F/U string check with annual exam in 1 month scheduled.   Kandis Cocking CNM

## 2016-02-26 LAB — NUSWAB VG+, CANDIDA 6SP
CANDIDA ALBICANS, NAA: NEGATIVE
CANDIDA GLABRATA, NAA: NEGATIVE
CANDIDA KRUSEI, NAA: NEGATIVE
CANDIDA LUSITANIAE, NAA: NEGATIVE
CANDIDA PARAPSILOSIS, NAA: NEGATIVE
CANDIDA TROPICALIS, NAA: NEGATIVE
Chlamydia trachomatis, NAA: NEGATIVE
Neisseria gonorrhoeae, NAA: NEGATIVE
Trich vag by NAA: NEGATIVE

## 2016-02-28 LAB — PAP IG AND HPV HIGH-RISK
HPV, HIGH-RISK: NEGATIVE
PAP Smear Comment: 0

## 2016-03-22 ENCOUNTER — Encounter: Payer: Self-pay | Admitting: Certified Nurse Midwife

## 2016-03-22 ENCOUNTER — Encounter (HOSPITAL_COMMUNITY): Payer: Self-pay | Admitting: Emergency Medicine

## 2016-03-22 ENCOUNTER — Ambulatory Visit (HOSPITAL_COMMUNITY)
Admission: EM | Admit: 2016-03-22 | Discharge: 2016-03-22 | Disposition: A | Discharge status: Home / Self Care | Payer: Medicaid Other | Attending: Emergency Medicine | Admitting: Emergency Medicine

## 2016-03-22 ENCOUNTER — Ambulatory Visit (INDEPENDENT_AMBULATORY_CARE_PROVIDER_SITE_OTHER): Payer: Medicaid Other | Admitting: Certified Nurse Midwife

## 2016-03-22 VITALS — BP 116/82 | HR 74 | Wt 113.0 lb

## 2016-03-22 DIAGNOSIS — Z0000 Encounter for general adult medical examination without abnormal findings: Secondary | ICD-10-CM

## 2016-03-22 DIAGNOSIS — J019 Acute sinusitis, unspecified: Secondary | ICD-10-CM

## 2016-03-22 DIAGNOSIS — Z01419 Encounter for gynecological examination (general) (routine) without abnormal findings: Secondary | ICD-10-CM | POA: Diagnosis not present

## 2016-03-22 DIAGNOSIS — R519 Headache, unspecified: Secondary | ICD-10-CM

## 2016-03-22 DIAGNOSIS — R51 Headache: Secondary | ICD-10-CM | POA: Diagnosis not present

## 2016-03-22 DIAGNOSIS — Z1389 Encounter for screening for other disorder: Secondary | ICD-10-CM

## 2016-03-22 DIAGNOSIS — M25511 Pain in right shoulder: Secondary | ICD-10-CM | POA: Diagnosis not present

## 2016-03-22 DIAGNOSIS — H60322 Hemorrhagic otitis externa, left ear: Secondary | ICD-10-CM

## 2016-03-22 LAB — POCT URINALYSIS DIPSTICK
Bilirubin, UA: NEGATIVE
Glucose, UA: NEGATIVE
KETONES UA: NEGATIVE
Nitrite, UA: NEGATIVE
PH UA: 6
SPEC GRAV UA: 1.01
UROBILINOGEN UA: NEGATIVE

## 2016-03-22 MED ORDER — IBUPROFEN 600 MG PO TABS: 600.0000 mg | ORAL_TABLET | Freq: Four times a day (QID) | ORAL | Status: DC | PRN

## 2016-03-22 MED ORDER — CIPROFLOXACIN-DEXAMETHASONE 0.3-0.1 % OT SUSP: 4.0000 [drp] | Freq: Two times a day (BID) | OTIC | Status: DC

## 2016-03-22 MED ORDER — PREDNISONE 20 MG PO TABS: ORAL_TABLET | ORAL | Status: DC

## 2016-03-22 MED ORDER — AMOXICILLIN-POT CLAVULANATE 875-125 MG PO TABS: 1.0000 | ORAL_TABLET | Freq: Two times a day (BID) | ORAL | Status: DC

## 2016-03-22 NOTE — ED Provider Notes (Signed)
CSN: [id number]     Arrival date & time 03/22/16  1453 History   First MD Initiated Contact with Patient 03/22/16 1624     Chief Complaint  Patient presents with  . Dizziness  . Facial Pain   (Consider location/radiation/quality/duration/timing/severity/associated sxs/prior Treatment) HPI  Neyda Mcclean is a 35 y.o. female presenting to UC with c/o frontal headache with tenderness over her sinuses, bilateral ear fullness and mild dizziness described more as nausea. Denies room spinning or lightheadedness. She has had mild congestion but denies cough or sore throat. She has not tried anything at home for the pain.  Pain is aching and sore, 6/10 in severity. No sick contacts or recent travel.  Pt also c/o Right shoulder pain that is aching and sore for at least 3 weeks after she tripped and fell at home. Pain is aching and sore, mild to moderate in severity. Worse with certain movements. She has taken tylenol w/o relief.     History reviewed. No pertinent past medical history. History reviewed. No pertinent past surgical history. No family history on file. Social History  Substance Use Topics  . Smoking status: Never Smoker   . Smokeless tobacco: None  . Alcohol Use: No   OB History    No data available     Review of Systems  Constitutional: Negative for fever and chills.  HENT: Positive for ear pain ( bilateral ear fullness), postnasal drip and sinus pressure. Negative for rhinorrhea, sore throat, trouble swallowing and voice change.   Respiratory: Negative for cough and shortness of breath.   Cardiovascular: Negative for chest pain and palpitations.  Gastrointestinal: Negative for nausea, vomiting, abdominal pain and diarrhea.  Musculoskeletal: Negative for myalgias, back pain and arthralgias.  Skin: Negative for rash.  Neurological: Positive for dizziness and headaches. Negative for syncope, weakness and light-headedness.    Allergies  Review of patient's allergies indicates no  known allergies.  Home Medications   Prior to Admission medications   Medication Sig Start Date End Date Taking? Authorizing Provider  amoxicillin-clavulanate (AUGMENTIN) 875-125 MG tablet Take 1 tablet by mouth 2 (two) times daily. One po bid x 7 days 03/22/16   Noland Fordyce, PA-C  ciprofloxacin-dexamethasone [facility]) otic suspension Place 4 drops into the left ear 2 (two) times daily. 03/22/16   Rachelle A Denney, CNM  ibuprofen (ADVIL,MOTRIN) 600 MG tablet Take 1 tablet (600 mg total) by mouth every 6 (six) hours as needed. 03/22/16   Noland Fordyce, PA-C  ibuprofen (ADVIL,MOTRIN) 800 MG tablet Take 1 tablet (800 mg total) by mouth every 8 (eight) hours as needed. 02/23/16   Rachelle A Denney, CNM  predniSONE (DELTASONE) 20 MG tablet 3 tabs po day one, then 2 po daily x 4 days 03/22/16   Noland Fordyce, PA-C   Meds Ordered and Administered this Visit  Medications - No data to display  BP 111/76 mmHg  Pulse 76  Temp(Src) 98.2 F (36.8 C) (Oral)  Resp 18  SpO2 100% No data found.   Physical Exam  Constitutional: She is oriented to person, place, and time. She appears well-developed and well-nourished.  HENT:  Head: Normocephalic and atraumatic.  Right Ear: Tympanic membrane is erythematous. Tympanic membrane is not bulging. No middle ear effusion.  Left Ear: Tympanic membrane is erythematous. Tympanic membrane is not bulging. A middle ear effusion is present.  Nose: Mucosal edema present. Right sinus exhibits maxillary sinus tenderness and frontal sinus tenderness. Left sinus exhibits frontal sinus tenderness. Left sinus exhibits no maxillary sinus  tenderness.  Mouth/Throat: Uvula is midline, oropharynx is clear and moist and mucous membranes are normal.  Eyes: EOM are normal. Pupils are equal, round, and reactive to light.  Neck: Normal range of motion. Neck supple.  Cardiovascular: Normal rate, regular rhythm and normal heart sounds.   Pulses:      Radial pulses are 2+ on the right  side.  Pulmonary/Chest: Effort normal and breath sounds normal. No respiratory distress. She has no wheezes. She has no rales.  Abdominal: Soft. She exhibits no distension. There is no tenderness.  Musculoskeletal: Normal range of motion. She exhibits tenderness. She exhibits no edema.  Right shoulder: no deformity or edema. Full ROM, tenderness to anterior and posterior aspect. No crepitus.  Neurological: She is alert and oriented to person, place, and time. She has normal strength. No cranial nerve deficit or sensory deficit. Coordination and gait normal.  Skin: Skin is warm and dry.  Psychiatric: She has a normal mood and affect. Her behavior is normal.  Nursing note and vitals reviewed.   ED Course  Procedures (including critical care time)  Labs Review Labs Reviewed - No data to display  Imaging Review No results found.    MDM   1. Acute rhinosinusitis   2. Frontal headache   3. Right shoulder pain    BP elevated initially reading 153/132,  Recheck BP- 111/76.  Pt in NAD. Another BP reading from possible other facility earlier this morning is recorded as 116/82.  Initial high read likely not accurate, false read.   Exam c/w sinusitis.  Will treat with antibiotics.    Rx: augmentin, ibuprofen and prednisone.   Right shoulder pain likely muscular in nature. Offered imaging, pt initially agreed but then decided to hold off as she needed to leave.   Encouraged to f/u with PCP for shoulder and in 7-10 days for URI symptoms if not improving. Patient verbalized understanding and agreement with treatment plan.    Noland Fordyce, PA-C 03/22/16 2006  Noland Fordyce, PA-C 03/22/16 2007

## 2016-03-22 NOTE — ED Notes (Signed)
PT reports dizziness and facial pain and pressure for one week. PT denies feeling like she might pass out or faint.

## 2016-03-22 NOTE — Progress Notes (Signed)
Patient ID: Carol Harrington, female   DOB: 27-Apr-1981, 35 y.o.   MRN: [id number]  I agree with note by NP Student Jillyn Ledger.  Was present for exam.  R.Halvor Behrend CNM

## 2016-03-22 NOTE — Discharge Instructions (Signed)

## 2016-03-22 NOTE — Progress Notes (Signed)
Patient ID: Carol Harrington, female   DOB: 01-Dec-1980, 35 y.o.   MRN: [id number]   Subjective:        Carol Harrington is a 35 y.o. female here for a routine exam.  Current complaints: vaginal itching x 1 week.  Dizziness, nausea & headache x 1 week.  Patient is [place] (from [place]).  Translator present, as English is a second language for this patient.  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  Patient reports not doing string checks (IUD).  Reports performing self breast exam each time she bathes.  Denies breast pain, lumps or nodules, abnormal bleeding, heart or lung issues, family or personal history of cancer.  Reports vaginal itching for last week.  Tried Monistat 7, with some relief.  Still some residual itching.  Reports feeling dizzy, nauseated, headache for last week.  Thinks that she has an ear infection.  Personal health questionnaire:  Is patient Ashkenazi Jewish, have a family history of breast and/or ovarian cancer: no Is there a family history of uterine cancer diagnosed at age < 39, gastrointestinal cancer, urinary tract cancer, family member who is a Field seismologist syndrome-associated carrier: no Is the patient overweight and hypertensive, family history of diabetes, personal history of gestational diabetes, preeclampsia or PCOS: no Is patient over 62, have PCOS,  family history of premature CHD under age 72, diabetes, smoke, have hypertension or peripheral artery disease:  no At any time, has a partner hit, kicked or otherwise hurt or frightened you?: no Over the past 2 weeks, have you felt down, depressed or hopeless?: no Over the past 2 weeks, have you felt little interest or pleasure in doing things?:no   Gynecologic History No LMP recorded. Patient is not currently having periods (Reason: IUD). Contraception: IUD Last Pap: unsure. Results were: normal Last mammogram: N/A. Results were: N/A  Obstetric History OB History  No data available    No past medical history on file.   No past surgical history on file.   Current outpatient prescriptions:  .  ibuprofen (ADVIL,MOTRIN) 800 MG tablet, Take 1 tablet (800 mg total) by mouth every 8 (eight) hours as needed., Disp: 60 tablet, Rfl: 1 .  ciprofloxacin-dexamethasone (CIPRODEX) otic suspension, Place 4 drops into the left ear 2 (two) times daily., Disp: 7.5 mL, Rfl: 0 .  [DISCONTINUED] diphenhydrAMINE (BENADRYL) 12.5 MG/5ML liquid, Take 5 mLs (12.5 mg total) by mouth 4 (four) times daily as needed for allergies. Mix 5 mL with 5 mL of Maalox. Hold in mouth and swallow. Take the 10 mL q 4-6 hr prn pain, Disp: 120 mL, Rfl: 0 No Known Allergies  Social History  Substance Use Topics  . Smoking status: Never Smoker   . Smokeless tobacco: Not on file  . Alcohol Use: No    No family history on file.    Review of Systems  Constitutional: negative for fatigue and weight loss Respiratory: negative for cough and wheezing Cardiovascular: negative for chest pain, fatigue and palpitations Gastrointestinal: negative for abdominal pain and change in bowel habits, positive for occasional nausea Musculoskeletal:negative for myalgias Neurological: negative for gait problems and tremors, Positive for occasional dizziness, headache. Behavioral/Psych: negative for abusive relationship, depression Endocrine: negative for temperature intolerance   Genitourinary:negative for abnormal menstrual periods, genital lesions, hot flashes, sexual problems and vaginal discharge Integument/breast: negative for breast lump, breast tenderness, nipple discharge and skin lesion(s)    Objective:       BP 116/82 mmHg  Pulse 74  Wt 113 lb (  51.256 kg) General:   alert  Skin:   no rash or abnormalities  ENT  left ear, erythema in canal.  Right ear: clear TM, no erythema noted  Lungs:   clear to auscultation bilaterally  Heart:   regular rate and rhythm, S1, S2 normal, no murmur, click, rub or gallop  Breasts:   normal without suspicious masses,  skin or nipple changes or axillary nodes  Abdomen:  normal findings: no organomegaly, soft, non-tender and no hernia  Pelvis:  External genitalia: normal general appearance Urinary system: urethral meatus normal and bladder without fullness, nontender Vaginal: normal without tenderness, induration or masses Cervix: normal appearance, Mirena IUD strings visualized Adnexa: normal bimanual exam Uterus: anteverted and non-tender, normal size   Lab Review Urine pregnancy test Labs reviewed yes Radiologic studies reviewed yes  75% of 30 min visit spent on counseling and coordination of care.   Assessment:    Healthy female exam.    PAP performed.  Declines STD testing.  Otitis externa   Plan:    Education reviewed: depression evaluation, low fat, low cholesterol diet, safe sex/STD prevention, self breast exams and weight bearing exercise. Contraception: IUD. Follow up in: 1 year. Treat otitis externa, otic drops.   Meds ordered this encounter  Medications  . ciprofloxacin-dexamethasone (CIPRODEX) otic suspension    Sig: Place 4 drops into the left ear 2 (two) times daily.    Dispense:  7.5 mL    Refill:  0   Orders Placed This Encounter  Procedures  . POCT urinalysis dipstick   Need to obtain previous records Possible management options include: alternate contraception methods (if patient desires change), otitis externa treatment, healthy diet / exercise. Follow up as needed. Follow up in 1 year for annual exam.

## 2016-03-24 LAB — PAP IG AND HPV HIGH-RISK
HPV, HIGH-RISK: NEGATIVE
PAP Smear Comment: 0

## 2016-03-26 LAB — NUSWAB VG+, CANDIDA 6SP
CANDIDA ALBICANS, NAA: NEGATIVE
CANDIDA GLABRATA, NAA: NEGATIVE
CANDIDA PARAPSILOSIS, NAA: NEGATIVE
CANDIDA TROPICALIS, NAA: NEGATIVE
Candida krusei, NAA: NEGATIVE
Candida lusitaniae, NAA: NEGATIVE
Chlamydia trachomatis, NAA: NEGATIVE
NEISSERIA GONORRHOEAE, NAA: NEGATIVE
Trich vag by NAA: NEGATIVE

## 2016-07-13 ENCOUNTER — Inpatient Hospital Stay (HOSPITAL_COMMUNITY)
Admission: AD | Admit: 2016-07-13 | Discharge: 2016-07-13 | Discharge status: Absent without leave | Payer: Medicaid Other | Attending: Obstetrics & Gynecology | Admitting: Obstetrics & Gynecology

## 2017-03-28 ENCOUNTER — Ambulatory Visit: Payer: Self-pay | Admitting: Certified Nurse Midwife

## 2017-10-27 ENCOUNTER — Encounter (HOSPITAL_COMMUNITY): Payer: Self-pay | Admitting: Emergency Medicine

## 2017-10-27 ENCOUNTER — Ambulatory Visit (HOSPITAL_COMMUNITY)
Admission: EM | Admit: 2017-10-27 | Discharge: 2017-10-27 | Disposition: A | Discharge status: Home / Self Care | Payer: Medicaid Other | Attending: Family Medicine | Admitting: Family Medicine

## 2017-10-27 DIAGNOSIS — J0100 Acute maxillary sinusitis, unspecified: Secondary | ICD-10-CM

## 2017-10-27 MED ORDER — AMOXICILLIN-POT CLAVULANATE 875-125 MG PO TABS: 1.0000 | ORAL_TABLET | Freq: Two times a day (BID) | ORAL | 0 refills | Status: DC

## 2017-10-27 MED ORDER — FLUTICASONE PROPIONATE 50 MCG/ACT NA SUSP: 1.0000 | Freq: Every day | NASAL | 2 refills | Status: DC

## 2017-10-27 NOTE — ED Triage Notes (Signed)
Patient c/o sore throat and coughing and congestion onset last week. Patient reports she has taken tylenol for pain which has not helped. Has had fever last week .

## 2017-10-31 NOTE — ED Provider Notes (Addendum)
  [place]   [id number] 10/27/17 Arrival Time: 8295  ASSESSMENT & PLAN:  1. Acute non-recurrent maxillary sinusitis     Meds ordered this encounter  Medications  . fluticasone (FLONASE) 50 MCG/ACT nasal spray    Sig: Place 1 spray into both nostrils daily.    Dispense:  16 g    Refill:  2  . amoxicillin-clavulanate (AUGMENTIN) 875-125 MG tablet    Sig: Take 1 tablet by mouth 2 (two) times daily. One po bid x 10 days    Dispense:  20 tablet    Refill:  0   Discussed typical duration of symptoms. OTC symptom care as needed. Ensure adequate fluid intake and rest. May f/u with PCP or here as needed.  Reviewed expectations re: course of current medical issues. Questions answered. Outlined signs and symptoms indicating need for more acute intervention. Patient verbalized understanding. After Visit Summary given.   SUBJECTIVE: History from: patient.  Hartlyn Neels is a 37 y.o. female who presents with complaint of nasal congestion, post-nasal drainage, and a persistent sinus pressure. Onset gradual, approximately 1-2 weeks ago. Mildly fatigued. SOB: none. Wheezing: none. Fever: yes, at onset of illness, none for the past week. Overall normal PO intake without n/v. Sick contacts: no. OTC treatment: Tylenol without much help.   Social History   Tobacco Use  Smoking Status Never Smoker  Smokeless Tobacco Never Used    ROS: As per HPI.   OBJECTIVE:  Vitals:   10/27/17 1940  BP: 118/76  Pulse: 71  Resp: 12  Temp: 98.6 F (37 C)  TempSrc: Oral  SpO2: 100%     General appearance: alert; appears fatigued HEENT: nasal congestion; clear runny nose; throat irritation secondary to post-nasal drainage; bilateral maxillary sinus tenderness to palpation Neck: supple without LAD Lungs: unlabored respirations, symmetrical air entry; cough: absent; no respiratory distress Skin: warm and dry Psychological: alert and cooperative; normal mood and affect  No Known  Allergies   Social History   Socioeconomic History  . Marital status: Married    Spouse name: Not on file  . Number of children: Not on file  . Years of education: Not on file  . Highest education level: Not on file  Social Needs  . Financial resource strain: Not on file  . Food insecurity - worry: Not on file  . Food insecurity - inability: Not on file  . Transportation needs - medical: Not on file  . Transportation needs - non-medical: Not on file  Occupational History  . Not on file  Tobacco Use  . Smoking status: Never Smoker  . Smokeless tobacco: Never Used  Substance and Sexual Activity  . Alcohol use: No  . Drug use: No  . Sexual activity: Yes    Birth control/protection: IUD  Other Topics Concern  . Not on file  Social History Narrative  . Not on file           Vanessa Kick, MD 10/31/17 6213    Vanessa Kick, MD 10/31/17 1710

## 2018-07-24 ENCOUNTER — Ambulatory Visit: Payer: Medicaid Other | Admitting: Certified Nurse Midwife

## 2018-08-07 ENCOUNTER — Encounter: Payer: Self-pay | Admitting: Family Medicine

## 2018-08-07 ENCOUNTER — Ambulatory Visit (INDEPENDENT_AMBULATORY_CARE_PROVIDER_SITE_OTHER): Payer: Medicaid Other | Admitting: Family Medicine

## 2018-08-07 ENCOUNTER — Other Ambulatory Visit (HOSPITAL_COMMUNITY)
Admission: RE | Admit: 2018-08-07 | Discharge: 2018-08-07 | Disposition: A | Discharge status: Home / Self Care | Payer: Medicaid Other | Source: Ambulatory Visit | Attending: Family Medicine | Admitting: Family Medicine

## 2018-08-07 VITALS — BP 104/72 | HR 87 | Ht 59.0 in | Wt 120.5 lb

## 2018-08-07 DIAGNOSIS — Z0000 Encounter for general adult medical examination without abnormal findings: Secondary | ICD-10-CM

## 2018-08-07 DIAGNOSIS — Z30432 Encounter for removal of intrauterine contraceptive device: Secondary | ICD-10-CM

## 2018-08-07 DIAGNOSIS — Z124 Encounter for screening for malignant neoplasm of cervix: Secondary | ICD-10-CM | POA: Diagnosis not present

## 2018-08-07 DIAGNOSIS — Z23 Encounter for immunization: Secondary | ICD-10-CM

## 2018-08-07 DIAGNOSIS — Z01419 Encounter for gynecological examination (general) (routine) without abnormal findings: Secondary | ICD-10-CM

## 2018-08-07 NOTE — Progress Notes (Signed)
Pt presents for IUD removal and she is due for an annual.  Last AEX 03/22/16.  Normal pap 03/22/2016.  She believes IUD is causing her to have R leg pain radiating up her R shoulder.

## 2018-08-07 NOTE — Patient Instructions (Signed)
Preventive Care 18-39 Years, Female Preventive care refers to lifestyle choices and visits with your health care provider that can promote health and wellness. What does preventive care include?  A yearly physical exam. This is also called an annual well check.  Dental exams once or twice a year.  Routine eye exams. Ask your health care provider how often you should have your eyes checked.  Personal lifestyle choices, including: ? Daily care of your teeth and gums. ? Regular physical activity. ? Eating a healthy diet. ? Avoiding tobacco and drug use. ? Limiting alcohol use. ? Practicing safe sex. ? Taking vitamin and mineral supplements as recommended by your health care provider. What happens during an annual well check? The services and screenings done by your health care provider during your annual well check will depend on your age, overall health, lifestyle risk factors, and family history of disease. Counseling Your health care provider may ask you questions about your:  Alcohol use.  Tobacco use.  Drug use.  Emotional well-being.  Home and relationship well-being.  Sexual activity.  Eating habits.  Work and work Statistician.  Method of birth control.  Menstrual cycle.  Pregnancy history.  Screening You may have the following tests or measurements:  Height, weight, and BMI.  Diabetes screening. This is done by checking your blood sugar (glucose) after you have not eaten for a while (fasting).  Blood pressure.  Lipid and cholesterol levels. These may be checked every 5 years starting at age 38.  Skin check.  Hepatitis C blood test.  Hepatitis B blood test.  Sexually transmitted disease (STD) testing.  BRCA-related cancer screening. This may be done if you have a family history of breast, ovarian, tubal, or peritoneal cancers.  Pelvic exam and Pap test. This may be done every 3 years starting at age 38. Starting at age 30, this may be done  every 5 years if you have a Pap test in combination with an HPV test.  Discuss your test results, treatment options, and if necessary, the need for more tests with your health care provider. Vaccines Your health care provider may recommend certain vaccines, such as:  Influenza vaccine. This is recommended every year.  Tetanus, diphtheria, and acellular pertussis (Tdap, Td) vaccine. You may need a Td booster every 10 years.  Varicella vaccine. You may need this if you have not been vaccinated.  HPV vaccine. If you are 39 or younger, you may need three doses over 6 months.  Measles, mumps, and rubella (MMR) vaccine. You may need at least one dose of MMR. You may also need a second dose.  Pneumococcal 13-valent conjugate (PCV13) vaccine. You may need this if you have certain conditions and were not previously vaccinated.  Pneumococcal polysaccharide (PPSV23) vaccine. You may need one or two doses if you smoke cigarettes or if you have certain conditions.  Meningococcal vaccine. One dose is recommended if you are age 68-21 years and a first-year college student living in a residence hall, or if you have one of several medical conditions. You may also need additional booster doses.  Hepatitis A vaccine. You may need this if you have certain conditions or if you travel or work in places where you may be exposed to hepatitis A.  Hepatitis B vaccine. You may need this if you have certain conditions or if you travel or work in places where you may be exposed to hepatitis B.  Haemophilus influenzae type b (Hib) vaccine. You may need this  if you have certain risk factors.  Talk to your health care provider about which screenings and vaccines you need and how often you need them. This information is not intended to replace advice given to you by your health care provider. Make sure you discuss any questions you have with your health care provider. Document Released: 11/21/2001 Document Revised:  06/14/2016 Document Reviewed: 07/27/2015 Elsevier Interactive Patient Education  2018 Elsevier Inc.  

## 2018-08-07 NOTE — Progress Notes (Signed)
  Subjective:     Carol Harrington is a 37 y.o. female and is here for a comprehensive physical exam. The patient reports problems - pelvic pain.Has IUD in x 2 years with right side pain that goes to top of shoulder to leg.  Works as a Scientist, forensic  The following portions of the patient's history were reviewed and updated as appropriate: allergies, current medications, past family history, past medical history, past social history, past surgical history and problem list.  Review of Systems Pertinent items noted in HPI and remainder of comprehensive ROS otherwise negative.   Objective:    BP 104/72   Pulse 87   Ht 4\' 11"  (1.499 m)   Wt 120 lb 8 oz (54.7 kg)   BMI 24.34 kg/m  General appearance: alert, cooperative and appears stated age Head: Normocephalic, without obvious abnormality, atraumatic Neck: no adenopathy, supple, symmetrical, trachea midline and thyroid not enlarged, symmetric, no tenderness/mass/nodules Lungs: clear to auscultation bilaterally Breasts: normal appearance, no masses or tenderness Heart: regular rate and rhythm, S1, S2 normal, no murmur, click, rub or gallop Abdomen: soft, non-tender; bowel sounds normal; no masses,  no organomegaly Pelvic: cervix normal in appearance, external genitalia normal, no adnexal masses or tenderness, no cervical motion tenderness, uterus normal size, shape, and consistency, vagina normal without discharge and IUD strings noted Extremities: Homans sign is negative, no sign of DVT Pulses: 2+ and symmetric Skin: Skin color, texture, turgor normal. No rashes or lesions Lymph nodes: Cervical, supraclavicular, and axillary nodes normal. Neurologic: Grossly normal   Procedure: Strings grasped with ring forceps.  IUD removed intact.  Assessment:    Healthy female exam.      Plan:  Encounter for IUD removal - declines other forms of contraception  Screening for malignant neoplasm of cervix - Plan: Cytology - PAP  Encounter for  gynecological examination without abnormal finding  Return in 1 year (on 08/08/2019).    See After Visit Summary for Counseling Recommendations

## 2018-08-09 LAB — CYTOLOGY - PAP
DIAGNOSIS: NEGATIVE
HPV (WINDOPATH): NOT DETECTED

## 2018-10-09 NOTE — L&D Delivery Note (Signed)
Patient complete and pushing. SVD of viable female infant over intact perineum. Nuchal cord none . Infant delivered to mom's abdomen. Delayed cord clamping x 1 minute. Cord clamped x 2, cut. Spontaneous cry heard. Weight and Apgars pending. Cord blood obtained. Cord Avulsion and manual removal of intact placenta. LUS cleared of clot Vagina inspected.no lacerations noted.  EBL: 366 cc Anesthesia: none Post delivery, Liletta removed from packaging, strings trimmed and placed with ring forcep to uterine fundus.

## 2019-01-20 DIAGNOSIS — Z1388 Encounter for screening for disorder due to exposure to contaminants: Secondary | ICD-10-CM | POA: Diagnosis not present

## 2019-01-20 DIAGNOSIS — Z3481 Encounter for supervision of other normal pregnancy, first trimester: Secondary | ICD-10-CM | POA: Diagnosis not present

## 2019-01-20 DIAGNOSIS — Z0389 Encounter for observation for other suspected diseases and conditions ruled out: Secondary | ICD-10-CM | POA: Diagnosis not present

## 2019-01-20 DIAGNOSIS — Z3009 Encounter for other general counseling and advice on contraception: Secondary | ICD-10-CM | POA: Diagnosis not present

## 2019-01-20 LAB — OB RESULTS CONSOLE ABO/RH: RH Type: POSITIVE

## 2019-01-20 LAB — OB RESULTS CONSOLE RUBELLA ANTIBODY, IGM: Rubella: IMMUNE

## 2019-01-20 LAB — OB RESULTS CONSOLE VARICELLA ZOSTER ANTIBODY, IGG: Varicella: IMMUNE

## 2019-01-20 LAB — OB RESULTS CONSOLE RPR: RPR: NONREACTIVE

## 2019-01-20 LAB — CULTURE, OB URINE: Urine Culture, OB: NEGATIVE

## 2019-01-20 LAB — OB RESULTS CONSOLE HIV ANTIBODY (ROUTINE TESTING): HIV: NONREACTIVE

## 2019-01-20 LAB — CYTOLOGY - PAP: Pap: NEGATIVE

## 2019-01-20 LAB — OB RESULTS CONSOLE HEPATITIS B SURFACE ANTIGEN: Hepatitis B Surface Ag: NEGATIVE

## 2019-01-20 LAB — OB RESULTS CONSOLE ANTIBODY SCREEN: Antibody Screen: NEGATIVE

## 2019-01-20 LAB — OB RESULTS CONSOLE HGB/HCT, BLOOD
HCT: 41 (ref 29–41)
Hemoglobin: 12.9

## 2019-01-20 LAB — OB RESULTS CONSOLE PLATELET COUNT: Platelets: 176

## 2019-01-20 LAB — CYSTIC FIBROSIS DIAGNOSTIC STUDY: Interpretation-CFDNA:: NEGATIVE

## 2019-01-20 LAB — OB RESULTS CONSOLE GC/CHLAMYDIA
Chlamydia: NEGATIVE
Gonorrhea: NEGATIVE

## 2019-01-20 LAB — SICKLE CELL SCREEN

## 2019-01-20 LAB — GLUCOSE TOLERANCE, 1 HOUR: Glucose, 1 Hour GTT: 149

## 2019-01-23 DIAGNOSIS — O99810 Abnormal glucose complicating pregnancy: Secondary | ICD-10-CM | POA: Diagnosis not present

## 2019-01-23 DIAGNOSIS — O09529 Supervision of elderly multigravida, unspecified trimester: Secondary | ICD-10-CM | POA: Diagnosis not present

## 2019-01-23 DIAGNOSIS — Z3481 Encounter for supervision of other normal pregnancy, first trimester: Secondary | ICD-10-CM | POA: Diagnosis not present

## 2019-01-23 DIAGNOSIS — Z789 Other specified health status: Secondary | ICD-10-CM | POA: Diagnosis not present

## 2019-01-23 DIAGNOSIS — R823 Hemoglobinuria: Secondary | ICD-10-CM | POA: Diagnosis not present

## 2019-01-23 LAB — GLUCOSE TOLERANCE, 3 HOURS
Glucose, GTT - 1 Hour: 184 (ref ?–200)
Glucose, GTT - 2 Hour: 185 — AB (ref ?–140)
Glucose, GTT - 3 Hour: 138 mg/dL (ref ?–140)
Glucose, GTT - Fasting: 78 mg/dL — AB (ref 80–110)

## 2019-02-03 ENCOUNTER — Telehealth: Payer: Self-pay | Admitting: Advanced Practice Midwife

## 2019-02-03 NOTE — Telephone Encounter (Signed)
Changing the patient visit to a virtual per Dr. Kennon Rounds. Transferred in from health department. The patient has labs and ect. completed.

## 2019-02-04 ENCOUNTER — Ambulatory Visit: Payer: Medicaid Other | Admitting: *Deleted

## 2019-02-04 ENCOUNTER — Other Ambulatory Visit: Payer: Self-pay

## 2019-02-04 ENCOUNTER — Other Ambulatory Visit: Payer: Self-pay | Admitting: *Deleted

## 2019-02-04 ENCOUNTER — Encounter: Payer: Medicaid Other | Attending: Obstetrics and Gynecology | Admitting: *Deleted

## 2019-02-04 DIAGNOSIS — Z3A13 13 weeks gestation of pregnancy: Secondary | ICD-10-CM | POA: Insufficient documentation

## 2019-02-04 DIAGNOSIS — O24439 Gestational diabetes mellitus in the puerperium, unspecified control: Secondary | ICD-10-CM

## 2019-02-04 DIAGNOSIS — R7302 Impaired glucose tolerance (oral): Secondary | ICD-10-CM

## 2019-02-04 MED ORDER — GLUCOSE BLOOD VI STRP: ORAL_STRIP | 12 refills | Status: DC

## 2019-02-04 MED ORDER — ACCU-CHEK FASTCLIX LANCETS MISC: 1.0000 | Freq: Four times a day (QID) | 12 refills | Status: DC

## 2019-02-04 MED ORDER — ACCU-CHEK GUIDE W/DEVICE KIT: 1.0000 | PACK | Freq: Once | 0 refills | Status: AC

## 2019-02-04 NOTE — Addendum Note (Signed)
Addended by: Dolores Hoose on: 02/04/2019 04:03 PM   Modules accepted: Orders

## 2019-02-04 NOTE — Progress Notes (Signed)
Patient was seen on 02/04/2019 for Impaired Glucose Tolerance at 13 weeks and pregnancy self-management. EDD 08/01/2019. Patient declined need for interpretor and spoke English quite well during the appointment. Patient states history of GDM diabetes with 3rd child who is now 40 years old.  Diet history obtained. Patient eats good variety of all food groups and beverages include water and milk.  She states she has a 5th grade education from when [place]  Patient is currently on no diabetes medications.  The following learning objectives were met by the patient :   States the definition of Impaired Glucose Tolerance at 13 weeks   States why dietary management is important in controlling blood glucose  Describes the effects of carbohydrates on blood glucose levels  Demonstrates ability to create a balanced meal plan  Demonstrates carbohydrate counting   States when to check blood glucose levels  Demonstrates proper blood glucose monitoring techniques  States the effect of stress and exercise on blood glucose levels  States the importance of limiting caffeine and abstaining from alcohol and smoking  Plan:   Aim for 3 Carb Choices per meal (45 grams) +/- 1 either way   Aim for 1-2 Carbs per snack  Begin reading food labels for Total Carbohydrate of foods  Consider  increasing your activity level by walking or other activity daily as tolerated  Begin checking BG before breakfast and 2 hours after first bite of breakfast, lunch and dinner as directed by MD   Bring Log Book/Sheet and meter to every medical appointment OR use Baby Scripts (see below)  Patient was introduced to [employer], she states she prefers to record BG on Log Sheet at this time.  Take medication if directed by MD  Blood glucose monitor Rx called into pharmacy: [place] with Fast Clix drums Patient instructed to test pre breakfast and 2 hours each meal as directed by MD  Patient  instructed to monitor glucose levels: FBS: 60 - 95 mg/dl 2 hour: <120 mg/dl  Patient received the following handouts: in [place]  Nutrition Diabetes and Pregnancy  Carbohydrate Counting List  Patient will be seen for follow-up in 1 month via phone visit.

## 2019-02-06 ENCOUNTER — Other Ambulatory Visit: Payer: Self-pay

## 2019-02-06 ENCOUNTER — Telehealth: Payer: Self-pay | Admitting: General Practice

## 2019-02-06 DIAGNOSIS — O24439 Gestational diabetes mellitus in the puerperium, unspecified control: Secondary | ICD-10-CM

## 2019-02-06 MED ORDER — ACCU-CHEK GUIDE ME W/DEVICE KIT: 1.0000 | PACK | Freq: Every day | 0 refills | Status: DC

## 2019-02-06 NOTE — Progress Notes (Signed)
Accu-Chek Guide with device kit was denied for patients insurance, per [employer] I ordered the correct kit to be approved.

## 2019-02-06 NOTE — Telephone Encounter (Signed)
Patient called and left message on nurse voicemail line stating she has a question about diabetes. Called patient, no answer- left message stating we are trying to reach you to return your phone call, please call us back.

## 2019-02-12 ENCOUNTER — Encounter: Payer: Self-pay | Admitting: General Practice

## 2019-02-12 DIAGNOSIS — O09522 Supervision of elderly multigravida, second trimester: Secondary | ICD-10-CM

## 2019-02-12 DIAGNOSIS — O24419 Gestational diabetes mellitus in pregnancy, unspecified control: Secondary | ICD-10-CM

## 2019-02-12 DIAGNOSIS — O0990 Supervision of high risk pregnancy, unspecified, unspecified trimester: Secondary | ICD-10-CM

## 2019-02-12 DIAGNOSIS — O24410 Gestational diabetes mellitus in pregnancy, diet controlled: Secondary | ICD-10-CM

## 2019-02-12 DIAGNOSIS — O09529 Supervision of elderly multigravida, unspecified trimester: Secondary | ICD-10-CM | POA: Insufficient documentation

## 2019-02-12 HISTORY — DX: Gestational diabetes mellitus in pregnancy, unspecified control: O24.419

## 2019-02-13 ENCOUNTER — Encounter: Payer: Self-pay | Admitting: *Deleted

## 2019-02-13 ENCOUNTER — Ambulatory Visit (INDEPENDENT_AMBULATORY_CARE_PROVIDER_SITE_OTHER): Payer: Medicaid Other | Admitting: Obstetrics and Gynecology

## 2019-02-13 ENCOUNTER — Encounter: Payer: Self-pay | Admitting: Obstetrics and Gynecology

## 2019-02-13 ENCOUNTER — Telehealth: Payer: Self-pay | Admitting: Obstetrics and Gynecology

## 2019-02-13 DIAGNOSIS — O24419 Gestational diabetes mellitus in pregnancy, unspecified control: Secondary | ICD-10-CM

## 2019-02-13 DIAGNOSIS — Z3A15 15 weeks gestation of pregnancy: Secondary | ICD-10-CM

## 2019-02-13 DIAGNOSIS — O0992 Supervision of high risk pregnancy, unspecified, second trimester: Secondary | ICD-10-CM

## 2019-02-13 DIAGNOSIS — O0990 Supervision of high risk pregnancy, unspecified, unspecified trimester: Secondary | ICD-10-CM

## 2019-02-13 MED ORDER — ASPIRIN EC 81 MG PO TBEC: 81.0000 mg | DELAYED_RELEASE_TABLET | Freq: Every day | ORAL | 2 refills | Status: DC

## 2019-02-13 NOTE — Progress Notes (Signed)
   TELEHEALTH VIRTUAL OBSTETRICS PRENATAL VISIT ENCOUNTER NOTE  I connected with Carol Harrington on 02/13/19 at  1:35 PM EDT by WebEx at home and verified that I am speaking with the correct person using two identifiers.   I discussed the limitations, risks, security and privacy concerns of performing an evaluation and management service by telephone and the availability of in person appointments. I also discussed with the patient that there may be a patient responsible charge related to this service. The patient expressed understanding and agreed to proceed. Subjective:  Carol Harrington is a 38 y.o. [id number] at [redacted]w[redacted]d being seen today for ongoing prenatal care.  She is currently monitored for the following issues for this high-risk pregnancy and has Encounter for IUD removal and reinsertion; Supervision of high risk pregnancy, antepartum; Gestational diabetes mellitus (GDM) affecting pregnancy, antepartum; and Advanced maternal age in multigravida on their problem list.  Patient reports no complaints.  Patient is transferring care from HD due to early diagnosis of GDM. Reports fetal movement. Contractions: Not present. Vag. Bleeding: None.  Movement: Absent. Denies any contractions, bleeding or leaking of fluid.   The following portions of the patient's history were reviewed and updated as appropriate: allergies, current medications, past family history, past medical history, past social history, past surgical history and problem list.   Objective:  There were no vitals filed for this visit.  Fetal Status:     Movement: Absent     General:  Alert, oriented and cooperative. Patient is in no acute distress.  Respiratory: Normal respiratory effort, no problems with respiration noted  Mental Status: Normal mood and affect. Normal behavior. Normal judgment and thought content.  Rest of physical exam deferred due to type of encounter  Assessment and Plan:  Pregnancy: [id number] at [redacted]w[redacted]d 1. Supervision of high risk  pregnancy, antepartum Patient is doing well without complaints Anatomy ultrasound ordered - [facility] AMB BABYSCRIPTS SCHEDULE OPTIMIZATION - US Fetal Echocardiography; Future  2. Gestational diabetes mellitus (GDM) affecting pregnancy, antepartum Patient seen by diabetic educator Fetal echo ordered Rx ASA provided CBGs reviewed with fasting 106 with values mainly in the 100's, 2 hr pp as high as 151. Advised patient to consume a protein rich snack. Patient states that she was controlled with diet only during her last pregnancy. Patient scheduled to meet with diabetic educator again this month - US Fetal Echocardiography; Future  Preterm labor symptoms and general obstetric precautions including but not limited to vaginal bleeding, contractions, leaking of fluid and fetal movement were reviewed in detail with the patient. I discussed the assessment and treatment plan with the patient. The patient was provided an opportunity to ask questions and all were answered. The patient agreed with the plan and demonstrated an understanding of the instructions. The patient was advised to call back or seek an in-person office evaluation/go to [facility]U at [facility] for any urgent or concerning symptoms. Please refer to After Visit Summary for other counseling recommendations.   I provided 15 minutes of face-to-face via WebEx time during this encounter.  No follow-ups on file.  Future Appointments  Date Time Provider [place]  03/04/2019  8:15 AM Reola Calkins [place]  03/06/2019  8:45 AM [place] 2 WH-MFCUS MFC-US    Mora Bellman, MD [facility] [employer], [place]

## 2019-02-13 NOTE — Telephone Encounter (Signed)
Called the patient to confirm the appointment and also ensure the cisco webex is downloaded.

## 2019-02-18 ENCOUNTER — Telehealth: Payer: Self-pay | Admitting: Lactation Services

## 2019-02-18 MED ORDER — TERCONAZOLE 0.4 % VA CREA: 1.0000 | TOPICAL_CREAM | Freq: Every day | VAGINAL | 0 refills | Status: DC

## 2019-02-18 NOTE — Telephone Encounter (Addendum)
Pt called and left message of nurse voice mail. Pt reports she has questions about itching and would like a call back. Routed to clinical Pool for follow up.   Called pt back at 2:21. Pt is reporting to pain and itching in her vagina. She notes some white discharge from her vagina.   Informed her we will send in a prescription to her Pharmacy per protocol. Pt voiced understanding. Advised pt to call back if she does not feel better after taking medication.

## 2019-02-26 ENCOUNTER — Telehealth (INDEPENDENT_AMBULATORY_CARE_PROVIDER_SITE_OTHER): Payer: Medicaid Other

## 2019-02-26 DIAGNOSIS — O0990 Supervision of high risk pregnancy, unspecified, unspecified trimester: Secondary | ICD-10-CM

## 2019-02-26 NOTE — Telephone Encounter (Signed)
Pt called stating that she had an Rx question.

## 2019-02-27 NOTE — Telephone Encounter (Signed)
Returned pt's call regarding her medications.  Pt states she used the vaginal cream for yeast for 3 days and the itching did not go away.  Advised pt to continue using the cream and then inform us on Tuesday, 03/04/19 when she is here for her visit for Diabetic Education, if she is still experiencing symptoms.  If she is, then we can perform a self swab for the pt.  Pt verbalized understanding.

## 2019-03-04 ENCOUNTER — Encounter: Payer: Medicaid Other | Attending: Obstetrics and Gynecology | Admitting: *Deleted

## 2019-03-04 ENCOUNTER — Ambulatory Visit: Payer: Medicaid Other | Admitting: *Deleted

## 2019-03-04 ENCOUNTER — Other Ambulatory Visit: Payer: Self-pay

## 2019-03-04 VITALS — Ht 59.0 in | Wt 124.1 lb

## 2019-03-04 DIAGNOSIS — O24439 Gestational diabetes mellitus in the puerperium, unspecified control: Secondary | ICD-10-CM | POA: Diagnosis not present

## 2019-03-04 DIAGNOSIS — Z3A13 13 weeks gestation of pregnancy: Secondary | ICD-10-CM | POA: Diagnosis not present

## 2019-03-04 DIAGNOSIS — N898 Other specified noninflammatory disorders of vagina: Secondary | ICD-10-CM

## 2019-03-04 DIAGNOSIS — R7302 Impaired glucose tolerance (oral): Secondary | ICD-10-CM

## 2019-03-04 NOTE — Progress Notes (Signed)
Patient was seen on 03/04/2019 for Impaired Glucose Tolerance at [redacted]w[redacted]d weeks and pregnancy self-management. EDD 08/01/2019. Patient declined need for interpretor and spoke English quite well during the appointment. Patient states history of GDM diabetes with 3rd child who is now 40 years old.    Patient states she continues to eat good variety of all food groups and beverages include water, milk and occasionally juice.  She has stated she has a 5th grade education from when she lived [place]  Patient is currently on no diabetes medications.   Plan: Continue  Aim for 3 Carb Choices per meal (45 grams) +/- 1 either way   Aim for 1-2 Carbs per snack  Begin reading food labels for Total Carbohydrate of foods  Consider  increasing your activity level by walking or other activity daily as tolerated  Begin checking BG before breakfast and 2 hours after first bite of breakfast, lunch and dinner as directed by MD   Bring Log Book/Sheet and meter to every medical appointment OR use Baby Scripts (see below)  Patient was reminded of Baby Scripts, she is willing to use it but needs assistance setting it up. She plans to get her Apple ID and e-mail password information from home. We will assist her setting it up in 2 days when she comes back for her ultrasound appt.    Take medication if directed by MD  BG Log Sheet review: FBG all within target ranges Post meal BG all but 2 within target ranges over the past 2 weeks.   She states she is eating brown bread with PNB for snack at night and her FBG have improved significantly since then.   Patient instructed to monitor glucose levels: FBS: 60 - 95 mg/dl 2 hour: <120 mg/dl  Patient received the following handouts: in English  BG Log Sheets  Patient will be seen for follow-up in 1 month via phone visit.

## 2019-03-04 NOTE — Progress Notes (Signed)
Spoke with pt in regards to her vaginal itching that she has been experiencing while she was in the office for Diabetes Education.    Pt reports she continued the Terazole for 7 days as told and reports she is feeling better with less itching noted. She reports she has a little mild external itching and does not have any discharge.   Spoke with Maye Hides, CNM and Verdell Carmine, RN. Plan is to follow up with provider at 20 weeks in person and will assess Vaginal itching again at that time.   Pt to follow up with Ultrasound on Thursday 5/28.

## 2019-03-05 ENCOUNTER — Ambulatory Visit: Payer: Medicaid Other | Admitting: *Deleted

## 2019-03-06 ENCOUNTER — Ambulatory Visit (HOSPITAL_COMMUNITY)
Admission: RE | Admit: 2019-03-06 | Discharge: 2019-03-06 | Disposition: A | Discharge status: Home / Self Care | Payer: Medicaid Other | Source: Ambulatory Visit | Attending: Obstetrics and Gynecology | Admitting: Obstetrics and Gynecology

## 2019-03-06 ENCOUNTER — Other Ambulatory Visit (HOSPITAL_COMMUNITY): Payer: Self-pay | Admitting: *Deleted

## 2019-03-06 ENCOUNTER — Encounter (HOSPITAL_COMMUNITY): Payer: Self-pay

## 2019-03-06 ENCOUNTER — Other Ambulatory Visit: Payer: Self-pay

## 2019-03-06 ENCOUNTER — Ambulatory Visit (HOSPITAL_COMMUNITY): Payer: Medicaid Other | Admitting: *Deleted

## 2019-03-06 VITALS — BP 102/62 | HR 85 | Temp 98.5°F

## 2019-03-06 DIAGNOSIS — O0990 Supervision of high risk pregnancy, unspecified, unspecified trimester: Secondary | ICD-10-CM | POA: Insufficient documentation

## 2019-03-06 DIAGNOSIS — O09523 Supervision of elderly multigravida, third trimester: Secondary | ICD-10-CM

## 2019-03-06 DIAGNOSIS — O24419 Gestational diabetes mellitus in pregnancy, unspecified control: Secondary | ICD-10-CM | POA: Diagnosis not present

## 2019-03-06 DIAGNOSIS — Z363 Encounter for antenatal screening for malformations: Secondary | ICD-10-CM | POA: Diagnosis not present

## 2019-03-06 DIAGNOSIS — O09522 Supervision of elderly multigravida, second trimester: Secondary | ICD-10-CM

## 2019-03-06 DIAGNOSIS — O289 Unspecified abnormal findings on antenatal screening of mother: Secondary | ICD-10-CM | POA: Diagnosis not present

## 2019-03-06 DIAGNOSIS — O24410 Gestational diabetes mellitus in pregnancy, diet controlled: Secondary | ICD-10-CM

## 2019-03-06 DIAGNOSIS — Z3A18 18 weeks gestation of pregnancy: Secondary | ICD-10-CM

## 2019-03-11 ENCOUNTER — Encounter (HOSPITAL_COMMUNITY): Payer: Self-pay

## 2019-03-11 ENCOUNTER — Ambulatory Visit (HOSPITAL_COMMUNITY)
Admission: EM | Admit: 2019-03-11 | Discharge: 2019-03-11 | Disposition: A | Discharge status: Home / Self Care | Payer: Medicaid Other | Attending: Family Medicine | Admitting: Family Medicine

## 2019-03-11 ENCOUNTER — Telehealth: Payer: Self-pay

## 2019-03-11 ENCOUNTER — Other Ambulatory Visit: Payer: Self-pay

## 2019-03-11 DIAGNOSIS — J029 Acute pharyngitis, unspecified: Secondary | ICD-10-CM | POA: Insufficient documentation

## 2019-03-11 LAB — POCT RAPID STREP A: Streptococcus, Group A Screen (Direct): NEGATIVE

## 2019-03-11 MED ORDER — ACETAMINOPHEN 325 MG PO TABS
650.0000 mg | ORAL_TABLET | Freq: Once | ORAL | Status: AC
  Administered 2019-03-11: 650 mg via ORAL

## 2019-03-11 MED ORDER — FLUTICASONE PROPIONATE 50 MCG/ACT NA SUSP: 1.0000 | Freq: Every day | NASAL | 2 refills | Status: DC

## 2019-03-11 MED ORDER — ACETAMINOPHEN 325 MG PO TABS
ORAL_TABLET | ORAL | Status: AC
  Filled 2019-03-11: qty 2

## 2019-03-11 MED ORDER — LIDOCAINE VISCOUS HCL 2 % MT SOLN: 15.0000 mL | OROMUCOSAL | 0 refills | Status: DC | PRN

## 2019-03-11 NOTE — Discharge Instructions (Signed)
10 using Tylenol for fever control. Use Flonase 1 spray in each nostril daily. May use viscous lidocaine for additional pain relief. We will call you with the results of your strep culture and write for anabiotic if indicated. Return if you develop worsening pain, difficulty swallowing or breathing.

## 2019-03-11 NOTE — ED Triage Notes (Signed)
Pt cc she has a cold , cough, fever and sore throat 3 days. Pt is [redacted] weeks pregnant.

## 2019-03-11 NOTE — Telephone Encounter (Addendum)
Pt called stating that she had a question about her medicine. Called pt and pt informed me that she has went to Urgent Care and they prescribed her a medication.  Per chart review pt was prescribed Flonase.  Pt states that she is going to pick up medication today.  I advised pt to try to take medication for a week and if she does not get better to please give the office a call. Pt agreed and did not have any other questions.

## 2019-03-11 NOTE — ED Provider Notes (Signed)
[name]    CSN: [id number] Arrival date & time: 03/11/19  1910     History   Chief Complaint Chief Complaint  Patient presents with  . Sore Throat    HPI Carol Harrington is a 38 y.o. female at [redacted] weeks gestation presenting for sore throat and fever.  Patient states that symptom onset was roughly 3 days ago.  Patient endorses odynophagia, scratchy throat, postnasal drip, fever.  She states that she will occasionally cough due to the scratching her throat.  States that this is nonproductive, not hemoptic.  Patient has not tried anything for this.  No known sick contacts or contacts with persons positive for COVID.  Patient denies headache, change in vision, nasal congestion, ear pain, difficulty swallowing/choking, abdominal pain, nausea, vomiting, diarrhea.    Past Medical History:  Diagnosis Date  . Diabetes mellitus without complication ([place])   . Gestational diabetes     Patient Active Problem List   Diagnosis Date Noted  . Supervision of high risk pregnancy, antepartum 02/12/2019  . Gestational diabetes mellitus (GDM) affecting pregnancy, antepartum 02/12/2019  . Advanced maternal age in multigravida 02/12/2019  . Encounter for IUD removal and reinsertion 02/23/2016    Past Surgical History:  Procedure Laterality Date  . NO PAST SURGERIES      OB History    Gravida  6   Para  4   Term  4   Preterm  0   AB  1   Living  4     SAB  1   TAB  0   Ectopic  0   Multiple  0   Live Births  4            Home Medications    Prior to Admission medications   Medication Sig Start Date End Date Taking? Authorizing Provider  Accu-Chek FastClix Lancets MISC 1 Device by Percutaneous route 4 (four) times daily. 02/04/19   Anyanwu, Sallyanne Havers, MD  aspirin EC 81 MG tablet Take 1 tablet (81 mg total) by mouth daily. Take after 12 weeks for prevention of preeclampsia later in pregnancy 02/13/19   Constant, Peggy, MD  Blood Glucose Monitoring Suppl (ACCU-CHEK  GUIDE ME) w/Device KIT 1 Device by Does not apply route daily. 02/06/19   Woodroe Mode, MD  fluticasone (FLONASE) 50 MCG/ACT nasal spray Place 1 spray into both nostrils daily. Patient not taking: Reported on 08/07/2018 10/27/17   Vanessa Kick, MD  fluticasone [facility]) 50 MCG/ACT nasal spray Place 1 spray into both nostrils daily. 03/11/19   Hall-Potvin, [place], PA-C  glucose blood (ACCU-CHEK GUIDE) test strip Use as instructed QID 02/04/19   Anyanwu, Sallyanne Havers, MD  lidocaine (XYLOCAINE) 2 % solution Use as directed 15 mLs in the mouth or throat as needed for mouth pain. 03/11/19   Hall-Potvin, [place], PA-C  Prenatal Vit-Fe Fumarate-FA (PRENATAL MULTIVITAMIN) TABS tablet Take 1 tablet by mouth daily at 12 noon.    [provider]  terconazole (TERAZOL 7) 0.4 % vaginal cream Place 1 applicator vaginally at bedtime. Patient not taking: Reported on 03/06/2019 02/18/19   Sloan Leiter, MD    Family History History reviewed. No pertinent family history.  Social History Social History   Tobacco Use  . Smoking status: Never Smoker  . Smokeless tobacco: Never Used  Substance Use Topics  . Alcohol use: No  . Drug use: No     Allergies   Patient has no known allergies.   Review of Systems  As per HPI   Physical Exam Triage Vital Signs ED Triage Vitals  Enc Vitals Group     BP 03/11/19 1921 109/66     Pulse Rate 03/11/19 1921 (!) 104     Resp 03/11/19 1921 16     Temp 03/11/19 1921 (!) 100.4 F (38 C)     Temp src --      SpO2 03/11/19 1921 100 %     Weight 03/11/19 1920 134 lb (60.8 kg)     Height --      Head Circumference --      Peak Flow --      Pain Score 03/11/19 1920 8     Pain Loc --      Pain Edu? --      Excl. in [place]? --    No data found.  Updated Vital Signs BP 109/66 (BP Location: Right Arm)   Pulse (!) 104   Temp (!) 100.4 F (38 C)   Resp 16   Wt 134 lb (60.8 kg)   LMP 10/27/2018 (Exact Date)   SpO2 100%   BMI 27.06 kg/m   Visual Acuity  Right Eye Distance:   Left Eye Distance:   Bilateral Distance:    Right Eye Near:   Left Eye Near:    Bilateral Near:     Physical Exam Constitutional:      General: She is not in acute distress. HENT:     Head: Normocephalic and atraumatic.     Right Ear: Tympanic membrane and ear canal normal. No middle ear effusion.     Left Ear: Tympanic membrane and ear canal normal.  No middle ear effusion.     Nose: No congestion or rhinorrhea.     Comments: Turbinate mildly edematous bilaterally, mucosal lining mildly injected.    Mouth/Throat:     Mouth: Mucous membranes are moist.     Pharynx: Oropharynx is clear. Uvula midline. No oropharyngeal exudate, posterior oropharyngeal erythema or uvula swelling.     Tonsils: No tonsillar exudate or tonsillar abscesses. 1+ on the right. 1+ on the left.     Comments: Geographic tongue noted, stable per patient Eyes:     General: No scleral icterus.    Conjunctiva/sclera: Conjunctivae normal.     Pupils: Pupils are equal, round, and reactive to light.  Neck:     Musculoskeletal: Normal range of motion and neck supple.     Thyroid: No thyromegaly.     Comments: Small, shotty anterior cervical lymphadenopathy that is nontender. Cardiovascular:     Rate and Rhythm: Regular rhythm. Tachycardia present.  Pulmonary:     Effort: Pulmonary effort is normal.  Lymphadenopathy:     Cervical: Cervical adenopathy present.  Skin:    General: Skin is warm.     Capillary Refill: Capillary refill takes less than 2 seconds.     Findings: No rash.  Neurological:     Mental Status: She is alert and oriented to person, place, and time.      UC Treatments / Results  Labs (all labs ordered are listed, but only abnormal results are displayed) Labs Reviewed  CULTURE, GROUP A STREP [facility])  POCT RAPID STREP A    EKG None  Radiology No results found.  Procedures Procedures (including critical care time)  Medications Ordered in UC Medications   acetaminophen (TYLENOL) tablet 650 mg (650 mg Oral Given 03/11/19 1932)    Initial Impression / Assessment and Plan / UC Course  I have  reviewed the triage vital signs and the nursing notes.  Pertinent labs & imaging results that were available during my care of the patient were reviewed by me and considered in my medical decision making (see chart for details).     38 year old female [redacted] weeks gestation presenting for fever and sore throat.  Per [facility] criteria guidelines strep swab done in house: Negative, will send for culture.  Patient to treat conservatively in interim, will treat with antibiotics if indicated.  Patient is agreeable to plan, verbalized understanding of return precautions. Final Clinical Impressions(s) / UC Diagnoses   Final diagnoses:  Viral pharyngitis     Discharge Instructions     10 using Tylenol for fever control. Use Flonase 1 spray in each nostril daily. May use viscous lidocaine for additional pain relief. We will call you with the results of your strep culture and write for anabiotic if indicated. Return if you develop worsening pain, difficulty swallowing or breathing.   ED Prescriptions    Medication Sig Dispense Auth. Provider   lidocaine (XYLOCAINE) 2 % solution Use as directed 15 mLs in the mouth or throat as needed for mouth pain. 100 mL Hall-Potvin, [place], PA-C   fluticasone (FLONASE) 50 MCG/ACT nasal spray Place 1 spray into both nostrils daily. 16 g Hall-Potvin, [place], PA-C     Controlled Substance Prescriptions [place] Controlled Substance Registry consulted? Not Applicable   Quincy Sheehan, [place] 03/11/19 2047

## 2019-03-14 LAB — CULTURE, GROUP A STREP (THRC)

## 2019-03-15 ENCOUNTER — Other Ambulatory Visit: Payer: Self-pay | Admitting: *Deleted

## 2019-03-15 DIAGNOSIS — Z20822 Contact with and (suspected) exposure to covid-19: Secondary | ICD-10-CM

## 2019-03-18 ENCOUNTER — Telehealth: Payer: Self-pay | Admitting: Critical Care Medicine

## 2019-03-18 DIAGNOSIS — U071 COVID-19: Secondary | ICD-10-CM

## 2019-03-18 HISTORY — DX: COVID-19: U07.1

## 2019-03-18 NOTE — Telephone Encounter (Signed)
Done

## 2019-03-18 NOTE — Telephone Encounter (Signed)
Patient needs a tele-visit to FU on COVID Sx with [place] on Monday or Tuesday next week. Patient is [place] speaking.

## 2019-03-18 NOTE — Telephone Encounter (Signed)
I reached Carol Harrington  today and informed her she is COVID positive  She only has mild symptoms of a slight dry cough muscle aches slight headache but no shortness of breath dizziness or diarrhea.  She was seen in urgent care on June 2 and was not tested with a viral faint pharyngitis syndrome and her strep test was negative.  Note she lives at home with a father-in-law and his wife the father and all recent was positive for COVID.  She has 4 children they are all healthy ages 47 1410 and 43 she does not have a husband lives with her at home.  She acknowledges she will be in isolation and needs to wear a mask around others and try to confine herself to a sick room.  We will set up a virtual  phone visit within the next week for follow-up  I also will notify her OB/GYN physician Dr. Elly Modena that the patient is positive for [place],  Need a tele visit next Monday with [name]   She speaks english

## 2019-03-18 NOTE — Addendum Note (Signed)
Addended by: Asencion Noble E on: 03/18/2019 12:47 PM   Modules accepted: Orders

## 2019-03-19 ENCOUNTER — Other Ambulatory Visit: Payer: Self-pay

## 2019-03-19 ENCOUNTER — Telehealth: Payer: Self-pay | Admitting: Obstetrics & Gynecology

## 2019-03-19 ENCOUNTER — Ambulatory Visit (INDEPENDENT_AMBULATORY_CARE_PROVIDER_SITE_OTHER): Payer: Medicaid Other | Admitting: Obstetrics & Gynecology

## 2019-03-19 DIAGNOSIS — Z3A20 20 weeks gestation of pregnancy: Secondary | ICD-10-CM

## 2019-03-19 DIAGNOSIS — U071 COVID-19: Secondary | ICD-10-CM

## 2019-03-19 DIAGNOSIS — O98512 Other viral diseases complicating pregnancy, second trimester: Secondary | ICD-10-CM | POA: Diagnosis not present

## 2019-03-19 DIAGNOSIS — O0990 Supervision of high risk pregnancy, unspecified, unspecified trimester: Secondary | ICD-10-CM

## 2019-03-19 LAB — NOVEL CORONAVIRUS, NAA: SARS-CoV-2, NAA: DETECTED — AB

## 2019-03-19 MED ORDER — BENZONATATE 100 MG PO CAPS: 200.0000 mg | ORAL_CAPSULE | Freq: Three times a day (TID) | ORAL | 0 refills | Status: DC | PRN

## 2019-03-19 MED ORDER — AMBULATORY NON FORMULARY MEDICATION: 1.0000 | 0 refills | Status: DC

## 2019-03-19 NOTE — Telephone Encounter (Signed)
Attempted to call patient. Was not able to reach patient, but left a detailed message.

## 2019-03-19 NOTE — Telephone Encounter (Signed)
Called patient to give her the next scheduled appointment with MyChart. With the help of her daughter, she was able to download MyChart app.

## 2019-03-19 NOTE — Progress Notes (Signed)
   TELEHEALTH VIRTUAL OBSTETRICS VISIT ENCOUNTER NOTE  I connected with Carol Harrington on 03/19/19 at 10:55 AM EDT by telephone at home and verified that I am speaking with the correct person using two identifiers.   I discussed the limitations, risks, security and privacy concerns of performing an evaluation and management service by telephone and the availability of in person appointments. I also discussed with the patient that there may be a patient responsible charge related to this service. The patient expressed understanding and agreed to proceed.  Subjective:  Carol Harrington is a 38 y.o. [id number] at 22w3dbeing followed for ongoing prenatal care.  She is currently monitored for the following issues for this high-risk pregnancy and has Supervision of high risk pregnancy, antepartum; Gestational diabetes mellitus (GDM) affecting pregnancy, antepartum; Advanced maternal age in multigravida; and CCOVID-26virus detected on their problem list.  Patient reports cough and SOB. Reports fetal movement. Denies any contractions, bleeding or leaking of fluid.   The following portions of the patient's history were reviewed and updated as appropriate: allergies, current medications, past family history, past medical history, past social history, past surgical history and problem list.   Objective:   General:  Alert, oriented and cooperative.   Mental Status: Normal mood and affect perceived. Normal judgment and thought content.  Rest of physical exam deferred due to type of encounter  Assessment and Plan:  Pregnancy: [id number] 226w3d. Supervision of high risk pregnancy, antepartum Needs babyscripts and MyChart - AMBULATORY NON FORMULARY MEDICATION; 1 Device by Other route once a week. Blood Pressure Cuff Medium Monitored Regularly at home ICD 10:Z34.90  Dispense: 1 kit; Refill: 0  2. COVID-19 virus detected Tessalon sent, use cough drops.   Preterm labor symptoms and general obstetric precautions  including but not limited to vaginal bleeding, contractions, leaking of fluid and fetal movement were reviewed in detail with the patient.  I discussed the assessment and treatment plan with the patient. The patient was provided an opportunity to ask questions and all were answered. The patient agreed with the plan and demonstrated an understanding of the instructions. The patient was advised to call back or seek an in-person office evaluation/[facility] concerning [facility]. Please refer to After Visit Summary for other counseling recommendations.   I provided 12 minutes of non-face-to-face time during this encounter.  No follow-ups on file.  Future Appointments  Date Time Provider [place]6/15/2020  9:30 AM WrElsie StainMD CHW-CHWW None  04/02/2019  2:00 PM WOUpper FruitlandOR[place]6/24/2020  3:00 PM WHMililani MaukaURSE WHOtisFC-US  04/02/2019  3:00 PM WHRoperSKorea [place]  JaEmeterio ReeveMDHudson Lakeor [facility]

## 2019-03-23 NOTE — Progress Notes (Signed)
Patient ID: Carol Harrington, female   DOB: March 27, 1981, 38 y.o.   MRN: [id number] Virtual Visit via Telephone Note  I connected with Carol Harrington on 03/23/19 at  9:30 AM EDT by telephone and verified that I am speaking with the correct person using two identifiers.   Consent:  I discussed the limitations, risks, security and privacy concerns of performing an evaluation and management service by telephone and the availability of in person appointments. I also discussed with the patient that there may be a patient responsible charge related to this service. The patient expressed understanding and agreed to proceed.  Location of patient: The patient was at home  Location of provider: I am in my office  Persons participating in the televisit with the patient.   No one else on the call    History of Present Illness: This patient had a positive COVID test from a mobile screening community event on June 6.  I had a brief phone call with this patient last week and the patient had mild symptoms.  This is a follow-up call and today the patient symptoms are markedly better.  There is minimal cough.  There is been no fever.  No muscle aches.  The patient shortness of breath is now resolved.  The patient's sense of taste and smell has read turned.  The patient is [redacted] weeks pregnant and needs a follow-up pregnancy ultrasound test.  The patient has been isolating herself at home  Pos in BOLD Constitutional:   No  weight loss, night sweats,  Fevers, chills, fatigue, lassitude. HEENT:   No headaches,  Difficulty swallowing,  Tooth/dental problems,  Sore throat,                No sneezing, itching, ear ache, nasal congestion, post nasal drip,   CV:  No chest pain,  Orthopnea, PND, swelling in lower extremities, anasarca, dizziness, palpitations  GI  No heartburn, indigestion, abdominal pain, nausea, vomiting, diarrhea, change in bowel habits, loss of appetite  Resp: No shortness of breath with exertion or at rest.  No  excess mucus, no productive cough,  No non-productive cough,  No coughing up of blood.  No change in color of mucus.  No wheezing.  No chest wall deformity  Skin: no rash or lesions.  GU: no dysuria, change in color of urine, no urgency or frequency.  No flank pain.  MS:  No joint pain or swelling.  No decreased range of motion.  No back pain.  Psych:  No change in mood or affect. No depression or anxiety.  No memory loss.    Observations/Objective: No observations this is a telephone visit  Assessment and Plan: COVID 19+ patient with viral pharyngitis symptoms are now completely resolved  Disease onset was May 29 and she is clearly 10 days plus out from her initiation of her illness  I indicated the patient she may now leave her isolation as long she wears a mask in public  She needs another COVID test so that she can undergo the ultrasound procedures therefore I will order a community-based testing for this patient at the [facility] testing site    Follow Up Instructions:    I discussed the assessment and treatment plan with the patient. The patient was provided an opportunity to ask questions and all were answered. The patient agreed with the plan and demonstrated an understanding of the instructions.   The patient was advised to call back or seek an in-person evaluation if  the symptoms worsen or if the condition fails to improve as anticipated.  I provided 30 minutes of non-face-to-face time during this encounter  including  median intraservice time , review of notes, labs, imaging, medications  and explaining diagnosis and management to the patient .    Asencion Noble, MD

## 2019-03-24 ENCOUNTER — Other Ambulatory Visit: Payer: Self-pay

## 2019-03-24 ENCOUNTER — Telehealth: Payer: Self-pay | Admitting: *Deleted

## 2019-03-24 ENCOUNTER — Ambulatory Visit: Payer: Medicaid Other | Attending: Critical Care Medicine | Admitting: Critical Care Medicine

## 2019-03-24 ENCOUNTER — Telehealth: Payer: Self-pay | Admitting: General Practice

## 2019-03-24 ENCOUNTER — Encounter: Payer: Self-pay | Admitting: Critical Care Medicine

## 2019-03-24 DIAGNOSIS — U071 COVID-19: Secondary | ICD-10-CM

## 2019-03-24 DIAGNOSIS — Z20822 Contact with and (suspected) exposure to covid-19: Secondary | ICD-10-CM

## 2019-03-24 DIAGNOSIS — R05 Cough: Secondary | ICD-10-CM

## 2019-03-24 DIAGNOSIS — Z331 Pregnant state, incidental: Secondary | ICD-10-CM | POA: Diagnosis not present

## 2019-03-24 NOTE — Addendum Note (Signed)
Addended by: Dimple Nanas on: 03/24/2019 04:45 PM   Modules accepted: Orders

## 2019-03-24 NOTE — Telephone Encounter (Signed)
-----   Message from Elsie Stain, MD sent at 03/24/2019  9:27 AM EDT ----- Regarding: Needs COVID test Carol Harrington  needs a COVID test  High risk pregnancy  Covid +   on 6/6  Now all symptoms resolved  Needs f/u COVID testing please.    She has Medicaid   Dob [birth year]

## 2019-03-24 NOTE — Progress Notes (Signed)
Patient verified DOB Patient has taken medication today. Patient has eaten today. Patient complains of cough being present that has gotten better. Cough is dry and every once and awhile she has clear sputum. Patient denies SOB/Muscle Aches/ Fever/N/V. Patients father and mother in law are positive and live in the home. They still have minimal symptoms. CBG: 79

## 2019-03-24 NOTE — Telephone Encounter (Signed)
Carol Stain, MD  [facility]        [name] needs a COVID test   High risk pregnancy Covid +  on 6/6   Now all symptoms resolved   Needs f/u COVID testing please.    She has Medicaid   Dob 06/03/1981     Left VM for pt to return call for covid 19 testing.

## 2019-03-24 NOTE — Telephone Encounter (Signed)
Pt called and scheduled for testing at [facility] site on 03/24/19. Pt advised to wear a mask and to remain in car for appt. Pt verbalized understanding.

## 2019-03-24 NOTE — Telephone Encounter (Signed)
lvm for pt to return call to schedule covid testing.

## 2019-03-25 ENCOUNTER — Other Ambulatory Visit: Payer: Medicaid Other

## 2019-03-25 DIAGNOSIS — R6889 Other general symptoms and signs: Secondary | ICD-10-CM | POA: Diagnosis not present

## 2019-03-25 DIAGNOSIS — Z20822 Contact with and (suspected) exposure to covid-19: Secondary | ICD-10-CM

## 2019-03-26 LAB — NOVEL CORONAVIRUS, NAA: SARS-CoV-2, NAA: NOT DETECTED

## 2019-03-27 ENCOUNTER — Telehealth (INDEPENDENT_AMBULATORY_CARE_PROVIDER_SITE_OTHER): Payer: Medicaid Other | Admitting: *Deleted

## 2019-03-27 DIAGNOSIS — O0990 Supervision of high risk pregnancy, unspecified, unspecified trimester: Secondary | ICD-10-CM

## 2019-03-27 NOTE — Telephone Encounter (Signed)
Called pt regarding babyscripts.  Per review, noted that pt has never logged a blood pressure.  Per chart review, BP cuff ordered 03/19/19.  Pt states she has not yet received her BP cuff or received a call from the pharmacy regarding her BP cuff.  Verified pt information. Will contact [facility].

## 2019-04-02 ENCOUNTER — Ambulatory Visit (HOSPITAL_COMMUNITY): Payer: Medicaid Other

## 2019-04-02 ENCOUNTER — Other Ambulatory Visit: Payer: Self-pay

## 2019-04-02 ENCOUNTER — Telehealth: Payer: Self-pay | Admitting: Medical

## 2019-04-02 ENCOUNTER — Ambulatory Visit: Payer: Medicaid Other | Admitting: *Deleted

## 2019-04-02 ENCOUNTER — Ambulatory Visit (HOSPITAL_COMMUNITY)
Admission: RE | Admit: 2019-04-02 | Discharge: 2019-04-02 | Disposition: A | Discharge status: Home / Self Care | Payer: Medicaid Other | Source: Ambulatory Visit | Attending: Obstetrics and Gynecology | Admitting: Obstetrics and Gynecology

## 2019-04-02 ENCOUNTER — Encounter: Payer: Medicaid Other | Attending: Obstetrics and Gynecology | Admitting: *Deleted

## 2019-04-02 DIAGNOSIS — Z3A13 13 weeks gestation of pregnancy: Secondary | ICD-10-CM | POA: Insufficient documentation

## 2019-04-02 DIAGNOSIS — R7302 Impaired glucose tolerance (oral): Secondary | ICD-10-CM

## 2019-04-02 DIAGNOSIS — O24439 Gestational diabetes mellitus in the puerperium, unspecified control: Secondary | ICD-10-CM | POA: Diagnosis not present

## 2019-04-02 NOTE — Telephone Encounter (Signed)
Called the patient to inform of upcoming visit, left a detailed voicemail of the date and time of the change in appointment. Also sending an appointment reminder with the [employer] web[employer] meetings instructions.

## 2019-04-02 NOTE — Progress Notes (Signed)
Patient was seen on 04/02/2019 for Impaired Glucose Tolerance at [redacted]w[redacted]d pregnancy self-management follow up visit. EDD 08/01/2019. Patient declined need for interpretor and spoke English quite well during the appointment. Patient states history of GDM diabetes with 3rd child who is now 40 years old.  She has stated she has a 5th grade education from [place]  Patient is currently on no diabetes medications.   Patient arrived in person for her follow up appointment with me, but had been scheduled as a My Chart Visit. I went ahead and spoke with her while she was here.  She is documented as testing + for Covid 19 on June 6th after her parent's in law had tested positive before that. She states she was retested on June 18 and she was negative. We agreed to meet while she is here instead of talking on the phone for our visit.   She states when she was not feeling well a couple of weeks ago, she lost her appetite and lost a few pounds. She states she was weighed yesterday at MD office at 121 pounds. I encouraged her to try and eat at least a couple of starchy foods as well as her meat and vegetables going forward, she agreed.    Plan: Continue  Aim for 3 Carb Choices per meal (45 grams) +/- 1 either way   Aim for 1-2 Carbs per snack  Consider  increasing your activity level by walking or other activity daily as tolerated  Begin checking BG before breakfast and 2 hours after first bite of breakfast, lunch and dinner as directed by MD   Bring Log Book/Sheet and meter to every medical appointment She has not been able to set up [employer] app, but she does bring her BG Log sheet to her appointments.  Take medication if directed by MD  BG Log Sheet review: FBG all within target ranges @ 78-86 mg/dl Post meal BG all but 2 within target ranges over the past 2 weeks.   Patient instructed to monitor glucose levels: FBS: 60 - 95 mg/dl 2 hour: <120 mg/dl  Patient received the  following handouts: in English  No new handouts today  Patient will be seen for follow-up as needed

## 2019-04-04 ENCOUNTER — Telehealth: Payer: Medicaid Other | Admitting: Medical

## 2019-04-04 ENCOUNTER — Other Ambulatory Visit: Payer: Self-pay

## 2019-04-04 ENCOUNTER — Ambulatory Visit (HOSPITAL_COMMUNITY): Payer: Medicaid Other | Admitting: *Deleted

## 2019-04-04 ENCOUNTER — Encounter (HOSPITAL_COMMUNITY): Payer: Self-pay

## 2019-04-04 ENCOUNTER — Ambulatory Visit (HOSPITAL_COMMUNITY)
Admission: RE | Admit: 2019-04-04 | Discharge: 2019-04-04 | Disposition: A | Discharge status: Home / Self Care | Payer: Medicaid Other | Source: Ambulatory Visit | Attending: Obstetrics and Gynecology | Admitting: Obstetrics and Gynecology

## 2019-04-04 DIAGNOSIS — O09522 Supervision of elderly multigravida, second trimester: Secondary | ICD-10-CM

## 2019-04-04 DIAGNOSIS — Z3A22 22 weeks gestation of pregnancy: Secondary | ICD-10-CM

## 2019-04-04 DIAGNOSIS — O0990 Supervision of high risk pregnancy, unspecified, unspecified trimester: Secondary | ICD-10-CM | POA: Insufficient documentation

## 2019-04-04 DIAGNOSIS — O24419 Gestational diabetes mellitus in pregnancy, unspecified control: Secondary | ICD-10-CM

## 2019-04-04 DIAGNOSIS — O24410 Gestational diabetes mellitus in pregnancy, diet controlled: Secondary | ICD-10-CM

## 2019-04-04 DIAGNOSIS — Z362 Encounter for other antenatal screening follow-up: Secondary | ICD-10-CM

## 2019-04-04 DIAGNOSIS — O289 Unspecified abnormal findings on antenatal screening of mother: Secondary | ICD-10-CM | POA: Diagnosis not present

## 2019-04-04 DIAGNOSIS — Z3490 Encounter for supervision of normal pregnancy, unspecified, unspecified trimester: Secondary | ICD-10-CM | POA: Diagnosis not present

## 2019-04-07 ENCOUNTER — Other Ambulatory Visit (HOSPITAL_COMMUNITY): Payer: Self-pay | Admitting: *Deleted

## 2019-04-07 DIAGNOSIS — O09523 Supervision of elderly multigravida, third trimester: Secondary | ICD-10-CM

## 2019-04-08 ENCOUNTER — Encounter: Payer: Self-pay | Admitting: Medical

## 2019-04-08 ENCOUNTER — Telehealth (INDEPENDENT_AMBULATORY_CARE_PROVIDER_SITE_OTHER): Payer: Medicaid Other | Admitting: Medical

## 2019-04-08 ENCOUNTER — Other Ambulatory Visit: Payer: Self-pay

## 2019-04-08 VITALS — BP 98/65 | HR 96

## 2019-04-08 DIAGNOSIS — M549 Dorsalgia, unspecified: Secondary | ICD-10-CM | POA: Diagnosis not present

## 2019-04-08 DIAGNOSIS — O9989 Other specified diseases and conditions complicating pregnancy, childbirth and the puerperium: Secondary | ICD-10-CM

## 2019-04-08 DIAGNOSIS — O09522 Supervision of elderly multigravida, second trimester: Secondary | ICD-10-CM | POA: Diagnosis not present

## 2019-04-08 DIAGNOSIS — O0992 Supervision of high risk pregnancy, unspecified, second trimester: Secondary | ICD-10-CM

## 2019-04-08 DIAGNOSIS — Z3A23 23 weeks gestation of pregnancy: Secondary | ICD-10-CM

## 2019-04-08 DIAGNOSIS — O24419 Gestational diabetes mellitus in pregnancy, unspecified control: Secondary | ICD-10-CM | POA: Diagnosis not present

## 2019-04-08 DIAGNOSIS — O0990 Supervision of high risk pregnancy, unspecified, unspecified trimester: Secondary | ICD-10-CM

## 2019-04-08 DIAGNOSIS — O99891 Other specified diseases and conditions complicating pregnancy: Secondary | ICD-10-CM

## 2019-04-08 NOTE — Progress Notes (Signed)
I connected with Carol Harrington on 04/08/19 at 11:15 AM EDT by: MyChart and verified that I am speaking with the correct person using two identifiers.  Patient is located at home and provider is located at [facility].     The purpose of this virtual visit is to provide medical care while limiting exposure to the novel coronavirus. I discussed the limitations, risks, security and privacy concerns of performing an evaluation and management service by MyChart and the availability of in person appointments. I also discussed with the patient that there may be a patient responsible charge related to this service. By engaging in this virtual visit, you consent to the provision of healthcare.  Additionally, you authorize for your insurance to be billed for the services provided during this visit.  The patient expressed understanding and agreed to proceed.  The following staff members participated in the virtual visit:  Pam Neal,CMA    PRENATAL VISIT NOTE  Subjective:  Carol Harrington is a 38 y.o. [id number] at [redacted]w[redacted]d  for phone visit for ongoing prenatal care.  She is currently monitored for the following issues for this high-risk pregnancy and has Supervision of high risk pregnancy, antepartum; Gestational diabetes mellitus (GDM) affecting pregnancy, antepartum; Advanced maternal age in multigravida; and COVID-40 virus detected on their problem list.  Patient reports backache.  Contractions: Not present. Vag. Bleeding: None.  Movement: Present. Denies leaking of fluid.   The following portions of the patient's history were reviewed and updated as appropriate: allergies, current medications, past family history, past medical history, past social history, past surgical history and problem list.   Objective:   Vitals:   04/08/19 1111  BP: 98/65  Pulse: 96   Self-Obtained  Fetal Status:     Movement: Present     Assessment and Plan:  Pregnancy: [id number] at [redacted]w[redacted]d 1. Supervision of high risk pregnancy, antepartum -  Doing well overall  2. Gestational diabetes mellitus (GDM) affecting pregnancy, antepartum - Fasting BS in 80s and PP 90-125 per patient  - Advised to continue dietary changes and checking BS x 4 daily - Encouraged to bring log to every appointment   3. Multigravida of advanced maternal age in second trimester  4. 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  Return in about 4 weeks (around 05/06/2019) for [facility], In-Person.  Future Appointments  Date Time Provider [place]  05/07/2019  9:00 AM [place] MFC-US  05/07/2019  9:00 AM WH-MFC [place] 3 WH-MFCUS MFC-US     Time spent on virtual visit: 10 minutes  Kerry Hough, PA-C

## 2019-04-08 NOTE — Progress Notes (Signed)
Pt states is having pain in right leg up to hip & buttocks.

## 2019-04-08 NOTE — Patient Instructions (Signed)

## 2019-05-06 ENCOUNTER — Ambulatory Visit (INDEPENDENT_AMBULATORY_CARE_PROVIDER_SITE_OTHER): Payer: Medicaid Other | Admitting: Family Medicine

## 2019-05-06 ENCOUNTER — Other Ambulatory Visit: Payer: Self-pay

## 2019-05-06 VITALS — BP 105/70 | HR 90 | Temp 98.2°F | Wt 124.0 lb

## 2019-05-06 DIAGNOSIS — O0990 Supervision of high risk pregnancy, unspecified, unspecified trimester: Secondary | ICD-10-CM | POA: Diagnosis not present

## 2019-05-06 DIAGNOSIS — U071 COVID-19: Secondary | ICD-10-CM | POA: Diagnosis not present

## 2019-05-06 DIAGNOSIS — O24419 Gestational diabetes mellitus in pregnancy, unspecified control: Secondary | ICD-10-CM

## 2019-05-06 DIAGNOSIS — O09522 Supervision of elderly multigravida, second trimester: Secondary | ICD-10-CM | POA: Diagnosis not present

## 2019-05-06 DIAGNOSIS — Z3A27 27 weeks gestation of pregnancy: Secondary | ICD-10-CM

## 2019-05-06 DIAGNOSIS — O98512 Other viral diseases complicating pregnancy, second trimester: Secondary | ICD-10-CM

## 2019-05-06 DIAGNOSIS — O09529 Supervision of elderly multigravida, unspecified trimester: Secondary | ICD-10-CM

## 2019-05-06 DIAGNOSIS — Z23 Encounter for immunization: Secondary | ICD-10-CM

## 2019-05-06 NOTE — Patient Instructions (Signed)
-   Continue to check your blood sugars as directed

## 2019-05-06 NOTE — Progress Notes (Signed)
   PRENATAL VISIT NOTE  Subjective:  Carol Harrington is a 37 y.o. [id number] at [redacted]w[redacted]d being seen today for ongoing prenatal care.  She is currently monitored for the following issues for this high-risk pregnancy and has Supervision of high risk pregnancy, antepartum; Gestational diabetes mellitus (GDM) affecting pregnancy, antepartum; Advanced maternal age in multigravida; and COVID-27 virus detected on their problem list.  GDM [ ]  fetal ECHO - not yet done [ ]  CBG - fasting range from 81 to 91 and 2-hr after meals is 95 to 130 (but most around 109-117)  *new sheet needed   Covid June - no SOB, cough, feeling okay   Patient reports no complaints.  Contractions: Not present. Vag. Bleeding: None.  Movement: Present. Denies leaking of fluid.   The following portions of the patient's history were reviewed and updated as appropriate: allergies, current medications, past family history, past medical history, past social history, past surgical history and problem list.   Objective:   Vitals:   05/06/19 0955  BP: 105/70  Pulse: 90  Temp: 98.2 F (36.8 C)  Weight: 124 lb (56.2 kg)    Fetal Status: Fetal Heart Rate (bpm): 151   Movement: Present     General:  Alert, oriented and cooperative. Patient is in no acute distress.  Skin: Skin is warm and dry. No rash noted.   Cardiovascular: Normal heart rate noted  Respiratory: Normal respiratory effort, no problems with respiration noted  Abdomen: Soft, gravid, appropriate for gestational age.  Pain/Pressure: Present     Pelvic: Cervical exam deferred        Extremities: Normal range of motion.  Edema: None  Mental Status: Normal mood and affect. Normal behavior. Normal judgment and thought content.   Assessment and Plan:  Pregnancy: [id number] at [redacted]w[redacted]d  1.Supervision of high risk pregnancy, antepartum  -- prenatal record reviewed and UTD  -- CBC, RPR today, Tdap given  2. COVID-19 virus detected -- now asymptomatic  3. Antepartum multigravida  of advanced maternal age -- f/u U/S with MFM tomorrow   4. Gestational diabetes mellitus (GDM) affecting pregnancy, antepartum -- f/u U/S with MFM tomorrow  -- has not yet had fetal ECHO, RN to call to assist with scheduling - will be with Dr. Gladstone Pih office  -- CBG in appropriate range, no indication for medication at this time   Preterm labor symptoms and general obstetric precautions including but not limited to vaginal bleeding, contractions, leaking of fluid and fetal movement were reviewed in detail with the patient. Please refer to After Visit Summary for other counseling recommendations.   Return in about 4 weeks (around 06/03/2019) for okay to be virtual to review blood sugars .  Future Appointments  Date Time Provider [place]  05/07/2019  9:00 AM [place] MFC-US  05/07/2019  9:00 AM [place] 3 WH-MFCUS MFC-US  06/04/2019  8:15 AM [place]loan Leiter, MD [place], DO

## 2019-05-07 ENCOUNTER — Ambulatory Visit (HOSPITAL_COMMUNITY)
Admission: RE | Admit: 2019-05-07 | Discharge: 2019-05-07 | Disposition: A | Discharge status: Home / Self Care | Payer: Medicaid Other | Source: Ambulatory Visit | Attending: Obstetrics and Gynecology | Admitting: Obstetrics and Gynecology

## 2019-05-07 ENCOUNTER — Encounter (HOSPITAL_COMMUNITY): Payer: Self-pay

## 2019-05-07 ENCOUNTER — Other Ambulatory Visit (HOSPITAL_COMMUNITY): Payer: Self-pay | Admitting: *Deleted

## 2019-05-07 ENCOUNTER — Ambulatory Visit (HOSPITAL_COMMUNITY): Payer: Medicaid Other | Admitting: *Deleted

## 2019-05-07 VITALS — BP 104/59 | HR 74 | Temp 98.4°F

## 2019-05-07 DIAGNOSIS — O09523 Supervision of elderly multigravida, third trimester: Secondary | ICD-10-CM | POA: Insufficient documentation

## 2019-05-07 DIAGNOSIS — Z3A27 27 weeks gestation of pregnancy: Secondary | ICD-10-CM

## 2019-05-07 DIAGNOSIS — O24410 Gestational diabetes mellitus in pregnancy, diet controlled: Secondary | ICD-10-CM

## 2019-05-07 DIAGNOSIS — O289 Unspecified abnormal findings on antenatal screening of mother: Secondary | ICD-10-CM

## 2019-05-07 DIAGNOSIS — O09522 Supervision of elderly multigravida, second trimester: Secondary | ICD-10-CM | POA: Diagnosis not present

## 2019-05-07 DIAGNOSIS — Z362 Encounter for other antenatal screening follow-up: Secondary | ICD-10-CM

## 2019-05-07 LAB — CBC
Hematocrit: 34.3 % (ref 34.0–46.6)
Hemoglobin: 11.5 g/dL (ref 11.1–15.9)
MCH: 27.4 pg (ref 26.6–33.0)
MCHC: 33.5 g/dL (ref 31.5–35.7)
MCV: 82 fL (ref 79–97)
Platelets: 157 10*3/uL (ref 150–450)
RBC: 4.2 x10E6/uL (ref 3.77–5.28)
RDW: 14.6 % (ref 11.7–15.4)
WBC: 8.4 10*3/uL (ref 3.4–10.8)

## 2019-05-07 LAB — RPR: RPR Ser Ql: NONREACTIVE

## 2019-05-30 IMAGING — US US MFM OB DETAIL +14 WK
1 series · 12 of 28 positions shown · non-contrast
Comparison: none

[Series 1: us mfm ob detail +14 wk · 12 of 109 slices shown]
[im 5/109]
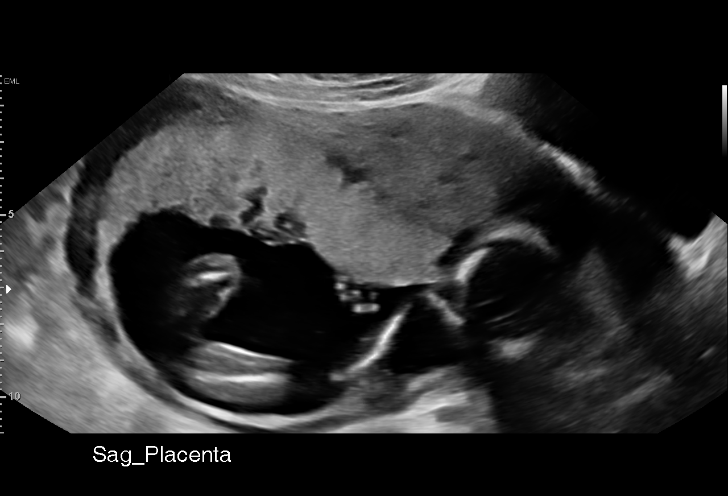
[im 13/109]
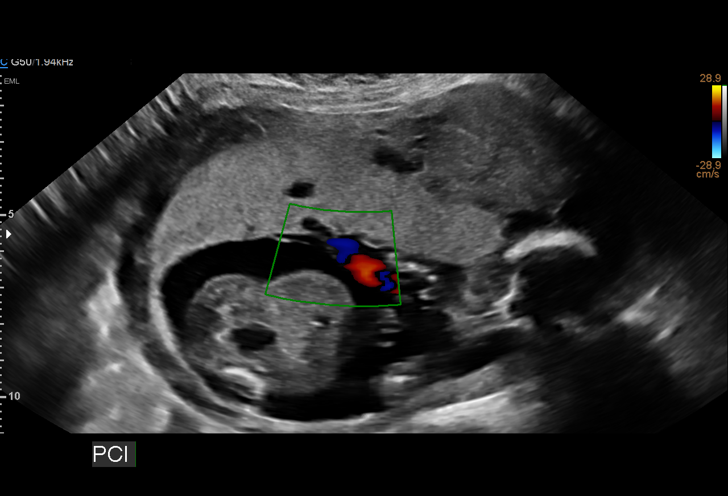
[im 21/109]
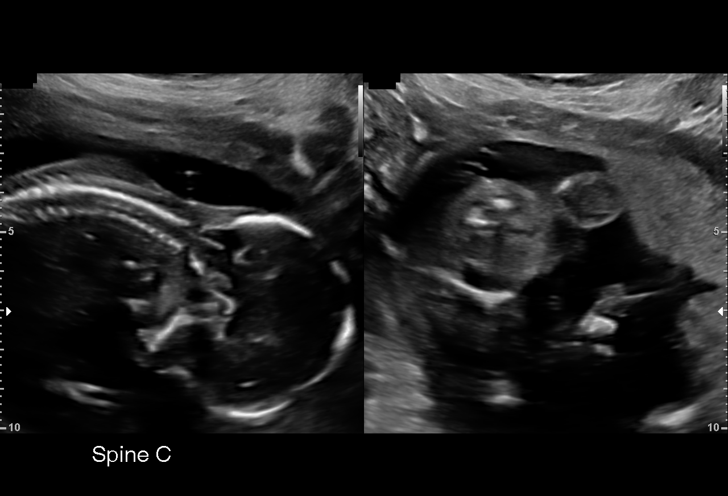
[im 33/109]
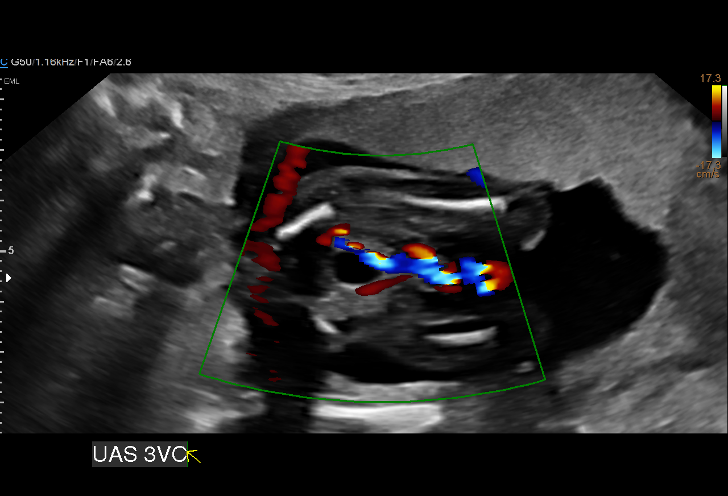
[im 41/109]
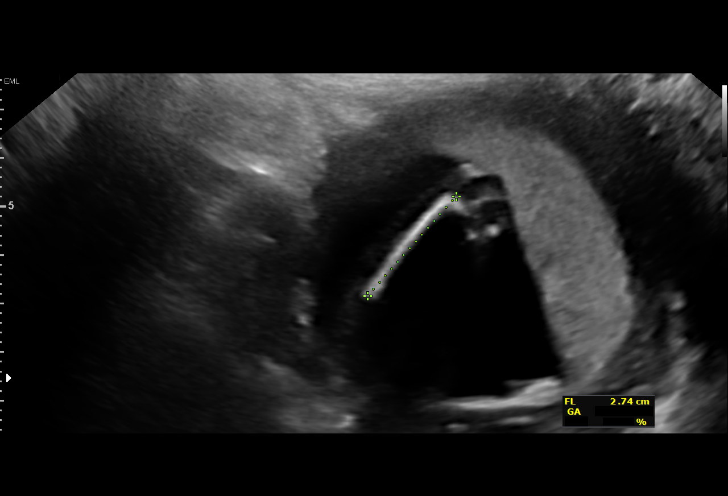
[im 49/109]
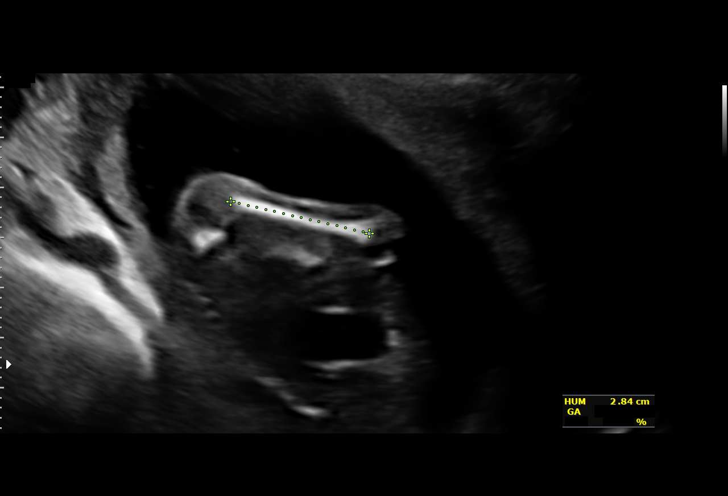
[im 61/109]
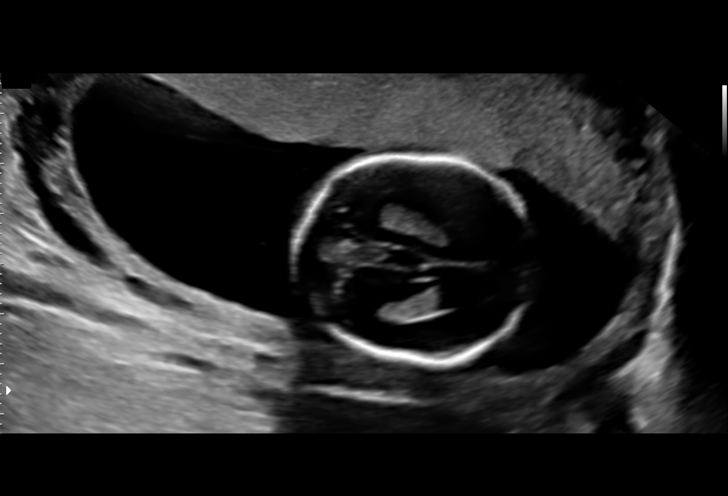
[im 69/109]
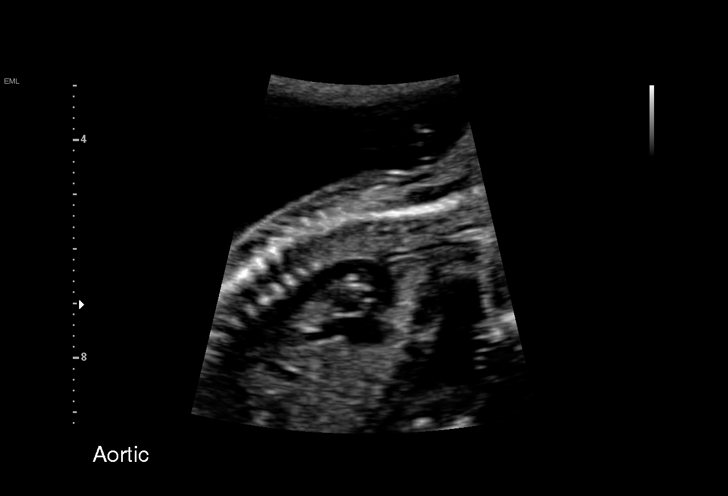
[im 77/109]
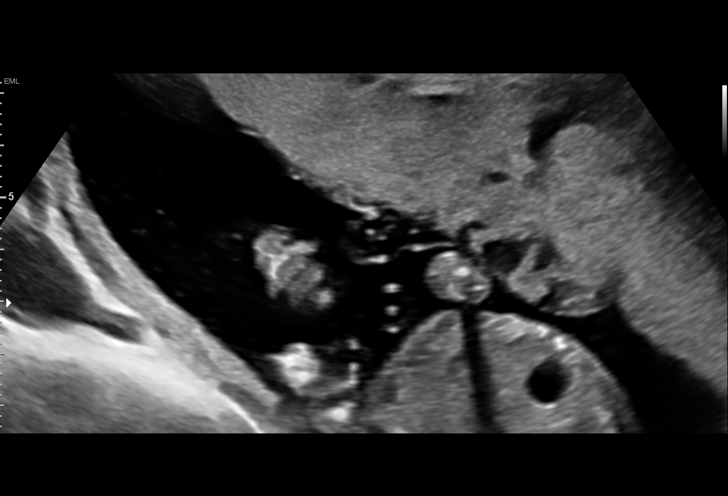
[im 89/109]
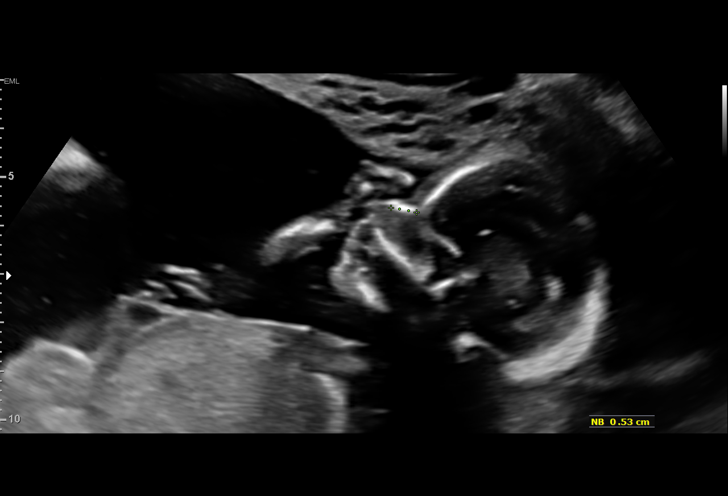
[im 97/109]
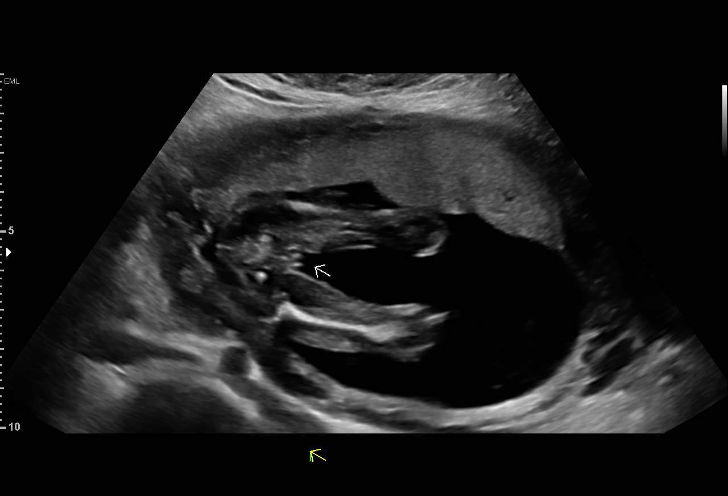
[im 105/109]
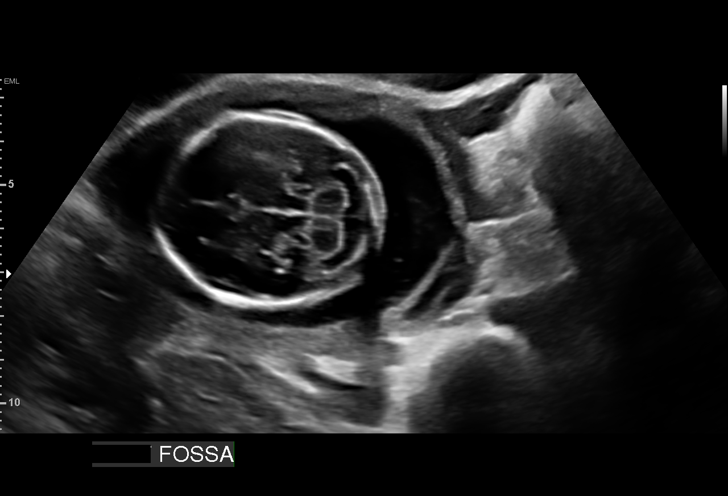

[12 of 28 positions shown; findings below may reference images not displayed]

----------------------------------------------------------------------

 ----------------------------------------------------------------------
Indications

  18 weeks gestation of pregnancy
  Encounter for antenatal screening for
  malformations (no genetic testing)
  Gestational diabetes in pregnancy, diet
  controlled
  Advanced maternal age multigravida 35+,
  second trimester
  Abnormal fetal ultrasound (intrahemispheric
  cyst)
 ----------------------------------------------------------------------
Vital Signs

 BMI:
Fetal Evaluation

 Num Of Fetuses:         1
 Fetal Heart Rate(bpm):  145
 Cardiac Activity:       Observed
 Presentation:           Variable
 Placenta:               Anterior
 P. Cord Insertion:      Visualized, central

 Amniotic Fluid
 AFI FV:      Within normal limits

                             Largest Pocket(cm)

Biometry

 BPD:      41.2  mm     G. Age:  18w 3d         47  %    CI:        75.64   %    70 - 86
                                                         FL/HC:      19.6   %    16.1 -
 HC:      150.2  mm     G. Age:  18w 1d         21  %    HC/AC:      1.14        1.09 -
 AC:      132.3  mm     G. Age:  18w 5d         52  %    FL/BPD:     71.4   %
 FL:       29.4  mm     G. Age:  19w 0d         61  %    FL/AC:      22.2   %    20 - 24
 HUM:      28.1  mm     G. Age:  19w 0d         66  %
 CER:      18.6  mm     G. Age:  18w 2d         41  %
 NFT:       2.8  mm
 LV:        5.6  mm
 CM:        5.9  mm

 Est. FW:     258  gm      0 lb 9 oz     51  %
OB History

 Gravidity:    6         Term:   4        Prem:   0        SAB:   1
 TOP:          0       Ectopic:  0        Living: 4
Gestational Age

 LMP:           18w 4d        Date:  10/27/18                 EDD:   08/03/19
 U/S Today:     18w 4d                                        EDD:   08/03/19
 Best:          18w 4d     Det. By:  LMP  (10/27/18)          EDD:   08/03/19
Anatomy

 Cranium:               Midline                LVOT:                   Appears normal
                        intrahemispheric
                        cyst
 Cavum:                 Appears normal         Aortic Arch:            Appears normal
 Ventricles:            Appears normal         Ductal Arch:            Appears normal
 Choroid Plexus:        Appears normal         Diaphragm:              Appears normal
 Cerebellum:            Appears normal         Stomach:                Appears normal, left
                                                                       sided
 Posterior Fossa:       Appears normal         Abdomen:                Appears normal
 Nuchal Fold:           Appears normal         Abdominal Wall:         Appears nml (cord
                                                                       insert, abd wall)
 Face:                  Appears normal         Cord Vessels:           Appears normal (3
                        (orbits and profile)                           vessel cord)
 Lips:                  Appears normal         Kidneys:                Appear normal
 Palate:                Appears normal         Bladder:                Appears normal
 Thoracic:              Appears normal         Spine:                  Appears normal
 Heart:                 Appears normal         Upper Extremities:      Appears normal
                        (4CH, axis, and
                        situs)
 RVOT:                  Appears normal         Lower Extremities:      Appears normal

 Other:  Normal genaitalia. Fetus appears to be female. Heels and 5th digit
         visualized. Nasal bone visualized.
Cervix Uterus Adnexa

 Cervix
 Length:           4.47  cm.
 Normal appearance by transabdominal scan.

 Left Ovary
 Not visualized.

 Right Ovary
 Not visualized.
 Cul De Sac
 No free fluid seen.

 Adnexa
 No abnormality visualized.
Impression

 Ms. Kpa, G6 P4, is here for fetal anatomy scan. She has not
 had screening for fetal aneuploidies. She has gestational
 diabetes that is well-controlled on diet. Obstetric history is
 significant for 4 previous term vaginal deliveries.

 We performed a fetal anatomy scan. A small midline cyst
 (postero-superior to thalamus) measuring 7 x 3 x 5
 millimeters, is seen. Color-Doppler showed no increased
 flow. No other markers of aneuploidies or fetal structural
 defects are seen. Fetal biometry is consistent with her
 previously-established dates. Amniotic fluid is normal and
 good fetal activity is seen.

 I explained that follow-up scan is necessary to evaluate the
 intracranial cyst as the gestational age is only 18 weeks. It
 may not be pathological. We will follow-up in 4 weeks.

 I discussed the option of cell-free fetal DNA screening for
 fetal aneuploidies. I also informed her that only
 amniocentesis will give a definitive result on the fetal
 karyotype.

 Patient understands the limitations of ultrasound in detecting
 fetal anomalies and opted not to have cell-free fetal DNA
 screening or amniocentesis.
Recommendations

 An appointment was made for her to return in 4 weeks for
 fetal growth assessment and to evaluate intracranial anatomy.
                 Jim, [place]

## 2019-06-03 ENCOUNTER — Telehealth: Payer: Self-pay | Admitting: Obstetrics and Gynecology

## 2019-06-03 NOTE — Telephone Encounter (Signed)
Attempted to call patient about her appointment on 8/26 @ 8:15. Patient instructed that the appointment is a mychart visit. Patient instructed to download the mychart app if not already done so. Patient instructed to give the office a call back if needing help getting the app.

## 2019-06-04 ENCOUNTER — Encounter (HOSPITAL_COMMUNITY): Payer: Self-pay

## 2019-06-04 ENCOUNTER — Telehealth (INDEPENDENT_AMBULATORY_CARE_PROVIDER_SITE_OTHER): Payer: Medicaid Other | Admitting: Obstetrics and Gynecology

## 2019-06-04 ENCOUNTER — Ambulatory Visit (HOSPITAL_COMMUNITY): Payer: Medicaid Other | Admitting: *Deleted

## 2019-06-04 ENCOUNTER — Other Ambulatory Visit (HOSPITAL_COMMUNITY): Payer: Self-pay | Admitting: *Deleted

## 2019-06-04 ENCOUNTER — Ambulatory Visit (HOSPITAL_COMMUNITY)
Admission: RE | Admit: 2019-06-04 | Discharge: 2019-06-04 | Disposition: A | Discharge status: Home / Self Care | Payer: Medicaid Other | Source: Ambulatory Visit | Attending: Obstetrics and Gynecology | Admitting: Obstetrics and Gynecology

## 2019-06-04 ENCOUNTER — Other Ambulatory Visit: Payer: Self-pay

## 2019-06-04 DIAGNOSIS — O09523 Supervision of elderly multigravida, third trimester: Secondary | ICD-10-CM

## 2019-06-04 DIAGNOSIS — Z3A31 31 weeks gestation of pregnancy: Secondary | ICD-10-CM | POA: Diagnosis not present

## 2019-06-04 DIAGNOSIS — O0993 Supervision of high risk pregnancy, unspecified, third trimester: Secondary | ICD-10-CM

## 2019-06-04 DIAGNOSIS — O98513 Other viral diseases complicating pregnancy, third trimester: Secondary | ICD-10-CM | POA: Diagnosis not present

## 2019-06-04 DIAGNOSIS — O24415 Gestational diabetes mellitus in pregnancy, controlled by oral hypoglycemic drugs: Secondary | ICD-10-CM

## 2019-06-04 DIAGNOSIS — O24419 Gestational diabetes mellitus in pregnancy, unspecified control: Secondary | ICD-10-CM | POA: Diagnosis not present

## 2019-06-04 DIAGNOSIS — O0990 Supervision of high risk pregnancy, unspecified, unspecified trimester: Secondary | ICD-10-CM | POA: Diagnosis not present

## 2019-06-04 DIAGNOSIS — Z362 Encounter for other antenatal screening follow-up: Secondary | ICD-10-CM | POA: Diagnosis not present

## 2019-06-04 DIAGNOSIS — O24410 Gestational diabetes mellitus in pregnancy, diet controlled: Secondary | ICD-10-CM | POA: Diagnosis not present

## 2019-06-04 DIAGNOSIS — U071 COVID-19: Secondary | ICD-10-CM

## 2019-06-04 DIAGNOSIS — O09529 Supervision of elderly multigravida, unspecified trimester: Secondary | ICD-10-CM

## 2019-06-04 MED ORDER — METFORMIN HCL 500 MG PO TABS: 500.0000 mg | ORAL_TABLET | Freq: Every day | ORAL | 5 refills | Status: DC

## 2019-06-04 NOTE — Progress Notes (Signed)
I connected with  [name] on 06/04/19 at  8:15 AM EDT by telephone and verified that I am speaking with the correct person using two identifiers.    I discussed the limitations, risks, security and privacy concerns of performing an evaluation and management service by telephone and the availability of in person appointments. I also discussed with the patient that there may be a patient responsible charge related to this service. The patient expressed understanding and agreed to proceed.  [name], RN 06/04/2019  8:24 AM

## 2019-06-04 NOTE — Progress Notes (Signed)
   TELEHEALTH VIRTUAL OBSTETRICS VISIT ENCOUNTER NOTE  I connected with Carol Harrington on 06/04/19 at  8:15 AM EDT by telephone at home and verified that I am speaking with the correct person using two identifiers.   I discussed the limitations, risks, security and privacy concerns of performing an evaluation and management service by telephone and the availability of in person appointments. I also discussed with the patient that there may be a patient responsible charge related to this service. The patient expressed understanding and agreed to proceed.  Subjective:  Carol Harrington is a 38 y.o. [id number] at [redacted]w[redacted]d being followed for ongoing prenatal care.  She is currently monitored for the following issues for this high-risk pregnancy and has Supervision of high risk pregnancy, antepartum; Gestational diabetes mellitus (GDM) affecting pregnancy, antepartum; Advanced maternal age in multigravida; and COVID-70 virus detected on their problem list.  Patient reports no complaints. Reports fetal movement. Denies any contractions, bleeding or leaking of fluid.   The following portions of the patient's history were reviewed and updated as appropriate: allergies, current medications, past family history, past medical history, past social history, past surgical history and problem list.   Objective:   General:  Alert, oriented and cooperative.   Mental Status: Normal mood and affect perceived. Normal judgment and thought content.  Rest of physical exam deferred due to type of encounter  Assessment and Plan:  Pregnancy: [id number] at [redacted]w[redacted]d  1. Supervision of high risk pregnancy, antepartum  2. Multigravida of advanced maternal age in third trimester Antenatal testing starting 36 weeks  3. Gestational diabetes mellitus (GDM) affecting pregnancy, antepartum FG: reports high 80s-90s, up to 100 PP: 100-120 fastings remain mildly elevated, will start metformin 500 QHS reviewed need to start weekly testing Patient  agreeable to plan  4. COVID-19 virus detected 03/15/2019  Preterm labor symptoms and general obstetric precautions including but not limited to vaginal bleeding, contractions, leaking of fluid and fetal movement were reviewed in detail with the patient.  I discussed the assessment and treatment plan with the patient. The patient was provided an opportunity to ask questions and all were answered. The patient agreed with the plan and demonstrated an understanding of the instructions. The patient was advised to call back or seek an in-person office evaluation/go to [facility]U at [facility] for any urgent or concerning symptoms. Please refer to After Visit Summary for other counseling recommendations.   I provided 15 minutes of non-face-to-face time during this encounter.  Return in about 2 weeks (around 06/18/2019) for OB visit (MD), virtual.  Future Appointments  Date Time Provider [place]  06/04/2019  9:30 AM [place] MFC-US  06/04/2019  9:30 AM [facility]-MFC [place] 1 WH-MFCUS MFC-US    Haizel Gatchell M Marquesa Rath, [place] for [place], [place]

## 2019-06-12 ENCOUNTER — Ambulatory Visit: Payer: Self-pay

## 2019-06-12 ENCOUNTER — Ambulatory Visit (INDEPENDENT_AMBULATORY_CARE_PROVIDER_SITE_OTHER): Payer: Medicaid Other | Admitting: *Deleted

## 2019-06-12 ENCOUNTER — Other Ambulatory Visit: Payer: Self-pay

## 2019-06-12 VITALS — BP 111/63 | HR 87 | Wt 126.9 lb

## 2019-06-12 DIAGNOSIS — O24419 Gestational diabetes mellitus in pregnancy, unspecified control: Secondary | ICD-10-CM | POA: Diagnosis not present

## 2019-06-12 DIAGNOSIS — O09523 Supervision of elderly multigravida, third trimester: Secondary | ICD-10-CM

## 2019-06-12 NOTE — Progress Notes (Signed)
Pt informed that the ultrasound is considered a limited OB ultrasound and is not intended to be a complete ultrasound exam.  Patient also informed that the ultrasound is not being completed with the intent of assessing for fetal or placental anomalies or any pelvic abnormalities.  Explained that the purpose of today's ultrasound is to assess for presentation, BPP and amniotic fluid volume.  Patient acknowledges the purpose of the exam and the limitations of the study.    CBG log reviewed - All fastings <90 except for one value of 94, All but one PP value were <120.

## 2019-06-18 ENCOUNTER — Telehealth: Payer: Self-pay | Admitting: Obstetrics & Gynecology

## 2019-06-18 NOTE — Telephone Encounter (Signed)
Opened in error

## 2019-06-18 NOTE — Telephone Encounter (Signed)
Called the patient to inform of the upcoming appointment, informed of wearing a face mask, no children or visitors due to covid19 restrictions. The patient also answered no to the covid19 screening questions.

## 2019-06-19 ENCOUNTER — Ambulatory Visit: Payer: Self-pay

## 2019-06-19 ENCOUNTER — Ambulatory Visit (INDEPENDENT_AMBULATORY_CARE_PROVIDER_SITE_OTHER): Payer: Medicaid Other | Admitting: *Deleted

## 2019-06-19 ENCOUNTER — Encounter: Payer: Medicaid Other | Admitting: Obstetrics and Gynecology

## 2019-06-19 ENCOUNTER — Other Ambulatory Visit: Payer: Self-pay

## 2019-06-19 VITALS — BP 104/62 | HR 85 | Wt 126.0 lb

## 2019-06-19 DIAGNOSIS — O24419 Gestational diabetes mellitus in pregnancy, unspecified control: Secondary | ICD-10-CM

## 2019-06-19 DIAGNOSIS — O09523 Supervision of elderly multigravida, third trimester: Secondary | ICD-10-CM

## 2019-06-19 NOTE — Progress Notes (Signed)

## 2019-06-23 ENCOUNTER — Encounter: Payer: Self-pay | Admitting: Obstetrics and Gynecology

## 2019-06-23 ENCOUNTER — Telehealth (INDEPENDENT_AMBULATORY_CARE_PROVIDER_SITE_OTHER): Payer: Medicaid Other | Admitting: Obstetrics and Gynecology

## 2019-06-23 ENCOUNTER — Other Ambulatory Visit: Payer: Self-pay

## 2019-06-23 DIAGNOSIS — O0993 Supervision of high risk pregnancy, unspecified, third trimester: Secondary | ICD-10-CM

## 2019-06-23 DIAGNOSIS — O09523 Supervision of elderly multigravida, third trimester: Secondary | ICD-10-CM

## 2019-06-23 DIAGNOSIS — O24419 Gestational diabetes mellitus in pregnancy, unspecified control: Secondary | ICD-10-CM | POA: Diagnosis not present

## 2019-06-23 DIAGNOSIS — O0990 Supervision of high risk pregnancy, unspecified, unspecified trimester: Secondary | ICD-10-CM

## 2019-06-23 DIAGNOSIS — Z3A34 34 weeks gestation of pregnancy: Secondary | ICD-10-CM

## 2019-06-23 NOTE — Progress Notes (Signed)
   TELEHEALTH VIRTUAL OBSTETRICS VISIT ENCOUNTER NOTE  I connected with Carol Harrington on 06/23/19 at  2:15 PM EDT by telephone at home and verified that I am speaking with the correct person using two identifiers.   I discussed the limitations, risks, security and privacy concerns of performing an evaluation and management service by telephone and the availability of in person appointments. I also discussed with the patient that there may be a patient responsible charge related to this service. The patient expressed understanding and agreed to proceed.  Subjective:  Carol Harrington is a 38 y.o. [id number] at [redacted]w[redacted]d being followed for ongoing prenatal care.  She is currently monitored for the following issues for this high-risk pregnancy and has Supervision of high risk pregnancy, antepartum; Gestational diabetes mellitus (GDM) affecting pregnancy, antepartum; Advanced maternal age in multigravida; and COVID-56 virus detected on their problem list.  Patient reports no complaints. Reports fetal movement. Denies any contractions, bleeding or leaking of fluid.   The following portions of the patient's history were reviewed and updated as appropriate: allergies, current medications, past family history, past medical history, past social history, past surgical history and problem list.   Objective:   General:  Alert, oriented and cooperative.   Mental Status: Normal mood and affect perceived. Normal judgment and thought content.  Rest of physical exam deferred due to type of encounter  Assessment and Plan:  Pregnancy: [id number] at [redacted]w[redacted]d 1. Supervision of high risk pregnancy, antepartum Stable GBS next visit  2. Gestational diabetes mellitus (GDM) affecting pregnancy, antepartum Reports CBG's in goal range with start of metformin Continue with Metformin and weekly testing  3. Multigravida of advanced maternal age in third trimester   Preterm labor symptoms and general obstetric precautions including but not  limited to vaginal bleeding, contractions, leaking of fluid and fetal movement were reviewed in detail with the patient.  I discussed the assessment and treatment plan with the patient. The patient was provided an opportunity to ask questions and all were answered. The patient agreed with the plan and demonstrated an understanding of the instructions. The patient was advised to call back or seek an in-person office evaluation/go to MAU at [facility] for any urgent or concerning symptoms. Please refer to After Visit Summary for other counseling recommendations.   I provided 8 minutes of non-face-to-face time during this encounter.  Return in about 2 weeks (around 07/07/2019) for OB visit, face to face.  Future Appointments  Date Time Provider [place]  06/26/2019  1:15 PM WOC-WOCA NST WOC-WOCA WOC  07/03/2019 10:15 AM WOC-WOCA NST WOC-WOCA WOC  07/09/2019  8:15 AM WH-MFC NURSE [place] MFC-US  07/09/2019  8:15 AM WH-MFC [place] 4 WH-MFCUS MFC-US  07/09/2019  9:55 AM Woodroe Mode, MD WOC-WOCA WOC  07/17/2019  2:15 PM WOC-WOCA NST WOC-WOCA WOC  07/17/2019  3:15 PM Aletha Halim, MD [place]  07/24/2019  8:15 AM WOC-WOCA NST WOC-WOCA WOC  07/24/2019  9:15 AM Sloan Leiter, MD [place]    Chancy Milroy, [place] for [place]

## 2019-06-26 ENCOUNTER — Ambulatory Visit (INDEPENDENT_AMBULATORY_CARE_PROVIDER_SITE_OTHER): Payer: Medicaid Other | Admitting: *Deleted

## 2019-06-26 ENCOUNTER — Ambulatory Visit: Payer: Self-pay

## 2019-06-26 ENCOUNTER — Other Ambulatory Visit: Payer: Self-pay

## 2019-06-26 VITALS — BP 110/66 | HR 85 | Wt 128.8 lb

## 2019-06-26 DIAGNOSIS — O09523 Supervision of elderly multigravida, third trimester: Secondary | ICD-10-CM

## 2019-06-26 DIAGNOSIS — O24419 Gestational diabetes mellitus in pregnancy, unspecified control: Secondary | ICD-10-CM

## 2019-07-02 ENCOUNTER — Telehealth: Payer: Self-pay | Admitting: Family Medicine

## 2019-07-02 NOTE — Telephone Encounter (Signed)
Attempted to call patient about her appointment on 9/24 @ 10:15. No answer left voicemail instructing patient to wear a face mask for the entire appointment and no visitors are allowed during the visit. Patient instructed not to attend the appointment if she was any symptoms. Symptom list and office number left.

## 2019-07-03 ENCOUNTER — Other Ambulatory Visit: Payer: Self-pay

## 2019-07-03 ENCOUNTER — Other Ambulatory Visit: Payer: Medicaid Other

## 2019-07-03 ENCOUNTER — Ambulatory Visit: Payer: Self-pay

## 2019-07-03 ENCOUNTER — Ambulatory Visit (INDEPENDENT_AMBULATORY_CARE_PROVIDER_SITE_OTHER): Payer: Medicaid Other | Admitting: *Deleted

## 2019-07-03 DIAGNOSIS — O24419 Gestational diabetes mellitus in pregnancy, unspecified control: Secondary | ICD-10-CM

## 2019-07-03 DIAGNOSIS — O09523 Supervision of elderly multigravida, third trimester: Secondary | ICD-10-CM | POA: Diagnosis not present

## 2019-07-03 DIAGNOSIS — Z3A35 35 weeks gestation of pregnancy: Secondary | ICD-10-CM

## 2019-07-03 DIAGNOSIS — O24415 Gestational diabetes mellitus in pregnancy, controlled by oral hypoglycemic drugs: Secondary | ICD-10-CM

## 2019-07-08 ENCOUNTER — Telehealth: Payer: Self-pay | Admitting: Obstetrics and Gynecology

## 2019-07-08 NOTE — Telephone Encounter (Signed)
Attempted to call patient about her appointment on 9/30 @ 9:55. No answer, voicemail was left instructing patient that the visit is a telephone visit and she does not have to come to the office. Patient instructed to be available around her appointment time. Patient instructed to give the office a call back if she is needing to reschedule.

## 2019-07-09 ENCOUNTER — Other Ambulatory Visit (HOSPITAL_COMMUNITY)
Admission: RE | Admit: 2019-07-09 | Discharge: 2019-07-09 | Disposition: A | Discharge status: Home / Self Care | Payer: Medicaid Other | Source: Ambulatory Visit | Attending: Obstetrics and Gynecology | Admitting: Obstetrics and Gynecology

## 2019-07-09 ENCOUNTER — Encounter (HOSPITAL_COMMUNITY): Payer: Self-pay

## 2019-07-09 ENCOUNTER — Ambulatory Visit (HOSPITAL_COMMUNITY)
Admission: RE | Admit: 2019-07-09 | Discharge: 2019-07-09 | Disposition: A | Discharge status: Home / Self Care | Payer: Medicaid Other | Source: Ambulatory Visit | Attending: Obstetrics and Gynecology | Admitting: Obstetrics and Gynecology

## 2019-07-09 ENCOUNTER — Ambulatory Visit (INDEPENDENT_AMBULATORY_CARE_PROVIDER_SITE_OTHER): Payer: Medicaid Other | Admitting: Obstetrics and Gynecology

## 2019-07-09 ENCOUNTER — Other Ambulatory Visit: Payer: Self-pay | Admitting: Obstetrics and Gynecology

## 2019-07-09 ENCOUNTER — Other Ambulatory Visit: Payer: Self-pay

## 2019-07-09 ENCOUNTER — Ambulatory Visit (HOSPITAL_COMMUNITY): Payer: Medicaid Other | Admitting: *Deleted

## 2019-07-09 VITALS — BP 112/75 | HR 91 | Wt 134.0 lb

## 2019-07-09 DIAGNOSIS — O289 Unspecified abnormal findings on antenatal screening of mother: Secondary | ICD-10-CM | POA: Diagnosis not present

## 2019-07-09 DIAGNOSIS — O09523 Supervision of elderly multigravida, third trimester: Secondary | ICD-10-CM

## 2019-07-09 DIAGNOSIS — Z3A36 36 weeks gestation of pregnancy: Secondary | ICD-10-CM

## 2019-07-09 DIAGNOSIS — Z362 Encounter for other antenatal screening follow-up: Secondary | ICD-10-CM

## 2019-07-09 DIAGNOSIS — O24419 Gestational diabetes mellitus in pregnancy, unspecified control: Secondary | ICD-10-CM

## 2019-07-09 DIAGNOSIS — O24410 Gestational diabetes mellitus in pregnancy, diet controlled: Secondary | ICD-10-CM | POA: Diagnosis not present

## 2019-07-09 DIAGNOSIS — O0990 Supervision of high risk pregnancy, unspecified, unspecified trimester: Secondary | ICD-10-CM | POA: Diagnosis not present

## 2019-07-09 DIAGNOSIS — O09529 Supervision of elderly multigravida, unspecified trimester: Secondary | ICD-10-CM

## 2019-07-09 DIAGNOSIS — O0993 Supervision of high risk pregnancy, unspecified, third trimester: Secondary | ICD-10-CM

## 2019-07-09 NOTE — Progress Notes (Signed)
Prenatal Visit Note Date: 07/09/2019 Clinic: [facility]  Subjective:  Carol Harrington is a 38 y.o. [id number] at [redacted]w[redacted]d being seen today for ongoing prenatal care.  She is currently monitored for the following issues for this high-risk pregnancy and has Supervision of high risk pregnancy, antepartum; GDM, class A2; Advanced maternal age in multigravida; and COVID-19 virus detected on their problem list.  Patient reports no complaints.   Contractions: Irregular. Vag. Bleeding: None.  Movement: Present. Denies leaking of fluid.   The following portions of the patient's history were reviewed and updated as appropriate: allergies, current medications, past family history, past medical history, past social history, past surgical history and problem list. Problem list updated.  Objective:   Vitals:   07/09/19 1029  BP: 112/75  Pulse: 91  Weight: 134 lb (60.8 kg)    Fetal Status: Fetal Heart Rate (bpm): 150   Movement: Present     General:  Alert, oriented and cooperative. Patient is in no acute distress.  Skin: Skin is warm and dry. No rash noted.   Cardiovascular: Normal heart rate noted  Respiratory: Normal respiratory effort, no problems with respiration noted  Abdomen: Soft, gravid, appropriate for gestational age. Pain/Pressure: Present     Pelvic:  Cervical exam performed Dilation: Fingertip Effacement (%): Thick Station: Ballotable  Extremities: Normal range of motion.     Mental Status: Normal mood and affect. Normal behavior. Normal judgment and thought content.   Urinalysis:      Assessment and Plan:  Pregnancy: [id number] at [redacted]w[redacted]d  1. Supervision of high risk pregnancy, antepartum Routine care. Leaning towards paragard - Culture, beta strep (group b only) - GC/Chlamydia Probe Amp  2. GDM, class A2 Normal BS log on metformin 500 qhs. mfm growth and bpp results pending from today. Continue qwk testing. Set up IOL at 39wks nv    3. Multigravida of advanced  maternal age in third trimester No issues  Preterm labor symptoms and general obstetric precautions including but not limited to vaginal bleeding, contractions, leaking of fluid and fetal movement were reviewed in detail with the patient. Please refer to After Visit Summary for other counseling recommendations.  Return in about 1 week (around 07/16/2019) for nst/bpp with diane, high risk, in person.   Aletha Halim, MD

## 2019-07-10 ENCOUNTER — Other Ambulatory Visit: Payer: Medicaid Other

## 2019-07-10 LAB — MOLECULAR ANCILLARY ONLY
Chlamydia: NEGATIVE
Molecular Disclaimer: NEGATIVE
Molecular Disclaimer: NORMAL
Neisseria Gonorrhea: NEGATIVE

## 2019-07-11 ENCOUNTER — Telehealth: Payer: Self-pay | Admitting: Obstetrics & Gynecology

## 2019-07-11 ENCOUNTER — Telehealth: Payer: Self-pay | Admitting: *Deleted

## 2019-07-11 NOTE — Telephone Encounter (Signed)
Attempted to call patient about her appointment changes. Left a message for her to call the office.

## 2019-07-11 NOTE — Telephone Encounter (Signed)
Carol Harrington called and left a message someone was calling her . Per chart registrars calling her - will route to registrars Linda,RN

## 2019-07-12 LAB — CULTURE, BETA STREP (GROUP B ONLY): Strep Gp B Culture: POSITIVE — AB

## 2019-07-13 ENCOUNTER — Encounter: Payer: Self-pay | Admitting: Obstetrics and Gynecology

## 2019-07-13 DIAGNOSIS — O99820 Streptococcus B carrier state complicating pregnancy: Secondary | ICD-10-CM | POA: Insufficient documentation

## 2019-07-15 ENCOUNTER — Ambulatory Visit: Payer: Self-pay

## 2019-07-15 ENCOUNTER — Ambulatory Visit (INDEPENDENT_AMBULATORY_CARE_PROVIDER_SITE_OTHER): Payer: Medicaid Other | Admitting: General Practice

## 2019-07-15 ENCOUNTER — Other Ambulatory Visit: Payer: Self-pay

## 2019-07-15 VITALS — BP 107/69 | HR 83 | Wt 139.0 lb

## 2019-07-15 DIAGNOSIS — O0990 Supervision of high risk pregnancy, unspecified, unspecified trimester: Secondary | ICD-10-CM

## 2019-07-15 DIAGNOSIS — O09523 Supervision of elderly multigravida, third trimester: Secondary | ICD-10-CM

## 2019-07-15 DIAGNOSIS — O24419 Gestational diabetes mellitus in pregnancy, unspecified control: Secondary | ICD-10-CM

## 2019-07-15 NOTE — Progress Notes (Signed)
Pt informed that the ultrasound is considered a limited OB ultrasound and is not intended to be a complete ultrasound exam.  Patient also informed that the ultrasound is not being completed with the intent of assessing for fetal or placental anomalies or any pelvic abnormalities.  Explained that the purpose of today's ultrasound is to assess for  BPP, presentation and AFI.  Patient acknowledges the purpose of the exam and the limitations of the study.    NST/BPP done today, 10/10. Patient will return on 10/8 for OB visit.   Koren Bound RN BSN 07/15/19

## 2019-07-17 ENCOUNTER — Other Ambulatory Visit: Payer: Medicaid Other

## 2019-07-17 ENCOUNTER — Ambulatory Visit (INDEPENDENT_AMBULATORY_CARE_PROVIDER_SITE_OTHER): Payer: Medicaid Other | Admitting: Obstetrics and Gynecology

## 2019-07-17 ENCOUNTER — Other Ambulatory Visit: Payer: Self-pay

## 2019-07-17 VITALS — BP 109/78 | HR 90 | Temp 98.5°F | Wt 129.2 lb

## 2019-07-17 DIAGNOSIS — O99820 Streptococcus B carrier state complicating pregnancy: Secondary | ICD-10-CM

## 2019-07-17 DIAGNOSIS — O0993 Supervision of high risk pregnancy, unspecified, third trimester: Secondary | ICD-10-CM

## 2019-07-17 DIAGNOSIS — O0990 Supervision of high risk pregnancy, unspecified, unspecified trimester: Secondary | ICD-10-CM

## 2019-07-17 DIAGNOSIS — O09523 Supervision of elderly multigravida, third trimester: Secondary | ICD-10-CM

## 2019-07-17 DIAGNOSIS — Z3A37 37 weeks gestation of pregnancy: Secondary | ICD-10-CM

## 2019-07-17 DIAGNOSIS — O24419 Gestational diabetes mellitus in pregnancy, unspecified control: Secondary | ICD-10-CM

## 2019-07-17 NOTE — Progress Notes (Signed)
Pt has Paper logs for Glucose readings

## 2019-07-17 NOTE — Progress Notes (Signed)
Prenatal Visit Note Date: 07/17/2019 Clinic: [facility]  Subjective:  Kentlee Bibeau is a 38 y.o. [id number] at [redacted]w[redacted]d being seen today for ongoing prenatal care.  She is currently monitored for the following issues for this high-risk pregnancy and has Supervision of high risk pregnancy, antepartum; GDM, class A2; Advanced maternal age in multigravida; COVID-19 virus detected; and GBS (group B Streptococcus carrier), +RV culture, currently pregnant on their problem list.  Patient reports no complaints.   Contractions: Irritability. Vag. Bleeding: None.  Movement: Present. Denies leaking of fluid.   The following portions of the patient's history were reviewed and updated as appropriate: allergies, current medications, past family history, past medical history, past social history, past surgical history and problem list. Problem list updated.  Objective:   Vitals:   07/17/19 1614  BP: 109/78  Pulse: 90  Temp: 98.5 F (36.9 C)  Weight: 129 lb 3.2 oz (58.6 kg)    Fetal Status: Fetal Heart Rate (bpm): 142   Movement: Present     General:  Alert, oriented and cooperative. Patient is in no acute distress.  Skin: Skin is warm and dry. No rash noted.   Cardiovascular: Normal heart rate noted  Respiratory: Normal respiratory effort, no problems with respiration noted  Abdomen: Soft, gravid, appropriate for gestational age. Pain/Pressure: Present     Pelvic:  Cervical exam deferred        Extremities: Normal range of motion.  Edema: None  Mental Status: Normal mood and affect. Normal behavior. Normal judgment and thought content.   Urinalysis:      Assessment and Plan:  Pregnancy: [id number] at [redacted]w[redacted]d  1. GBS (group B Streptococcus carrier), +RV culture, currently pregnant  2. Supervision of high risk pregnancy, antepartum Will set up for 39wk iol today Continue with weekly testing  3. Multigravida of advanced maternal age in third trimester  4. GDM, class A2 Normal bs  long on metformin 500 qhs. bpp 10/10 on 10/6  Term labor symptoms and general obstetric precautions including but not limited to vaginal bleeding, contractions, leaking of fluid and fetal movement were reviewed in detail with the patient. Please refer to After Visit Summary for other counseling recommendations.  Return in about 1 week (around 07/24/2019).   Aletha Halim, MD

## 2019-07-18 ENCOUNTER — Telehealth (HOSPITAL_COMMUNITY): Payer: Self-pay | Admitting: *Deleted

## 2019-07-18 ENCOUNTER — Encounter (HOSPITAL_COMMUNITY): Payer: Self-pay | Admitting: *Deleted

## 2019-07-18 NOTE — Telephone Encounter (Signed)
Preadmission screen  

## 2019-07-20 ENCOUNTER — Other Ambulatory Visit: Payer: Self-pay

## 2019-07-24 ENCOUNTER — Ambulatory Visit (INDEPENDENT_AMBULATORY_CARE_PROVIDER_SITE_OTHER): Payer: Medicaid Other | Admitting: General Practice

## 2019-07-24 ENCOUNTER — Ambulatory Visit (INDEPENDENT_AMBULATORY_CARE_PROVIDER_SITE_OTHER): Payer: Medicaid Other | Admitting: Obstetrics and Gynecology

## 2019-07-24 ENCOUNTER — Other Ambulatory Visit: Payer: Self-pay

## 2019-07-24 ENCOUNTER — Ambulatory Visit: Payer: Self-pay

## 2019-07-24 ENCOUNTER — Encounter (HOSPITAL_COMMUNITY): Payer: Self-pay | Admitting: *Deleted

## 2019-07-24 ENCOUNTER — Inpatient Hospital Stay (HOSPITAL_COMMUNITY)
Admission: AD | Admit: 2019-07-24 | Discharge: 2019-07-24 | Disposition: A | Discharge status: Home / Self Care | Payer: Medicaid Other | Attending: Obstetrics and Gynecology | Admitting: Obstetrics and Gynecology

## 2019-07-24 ENCOUNTER — Encounter: Payer: Self-pay | Admitting: Obstetrics and Gynecology

## 2019-07-24 VITALS — BP 111/73 | HR 76 | Wt 130.0 lb

## 2019-07-24 DIAGNOSIS — O0990 Supervision of high risk pregnancy, unspecified, unspecified trimester: Secondary | ICD-10-CM

## 2019-07-24 DIAGNOSIS — Z3A38 38 weeks gestation of pregnancy: Secondary | ICD-10-CM | POA: Diagnosis not present

## 2019-07-24 DIAGNOSIS — E119 Type 2 diabetes mellitus without complications: Secondary | ICD-10-CM | POA: Insufficient documentation

## 2019-07-24 DIAGNOSIS — O471 False labor at or after 37 completed weeks of gestation: Secondary | ICD-10-CM | POA: Diagnosis not present

## 2019-07-24 DIAGNOSIS — O99820 Streptococcus B carrier state complicating pregnancy: Secondary | ICD-10-CM

## 2019-07-24 DIAGNOSIS — Z7982 Long term (current) use of aspirin: Secondary | ICD-10-CM | POA: Diagnosis not present

## 2019-07-24 DIAGNOSIS — O24419 Gestational diabetes mellitus in pregnancy, unspecified control: Secondary | ICD-10-CM

## 2019-07-24 DIAGNOSIS — O10013 Pre-existing essential hypertension complicating pregnancy, third trimester: Secondary | ICD-10-CM | POA: Diagnosis not present

## 2019-07-24 DIAGNOSIS — R109 Unspecified abdominal pain: Secondary | ICD-10-CM | POA: Insufficient documentation

## 2019-07-24 DIAGNOSIS — U071 COVID-19: Secondary | ICD-10-CM

## 2019-07-24 DIAGNOSIS — O479 False labor, unspecified: Secondary | ICD-10-CM

## 2019-07-24 DIAGNOSIS — O24415 Gestational diabetes mellitus in pregnancy, controlled by oral hypoglycemic drugs: Secondary | ICD-10-CM | POA: Diagnosis not present

## 2019-07-24 DIAGNOSIS — O0993 Supervision of high risk pregnancy, unspecified, third trimester: Secondary | ICD-10-CM

## 2019-07-24 DIAGNOSIS — Z3689 Encounter for other specified antenatal screening: Secondary | ICD-10-CM | POA: Diagnosis not present

## 2019-07-24 DIAGNOSIS — O98511 Other viral diseases complicating pregnancy, first trimester: Secondary | ICD-10-CM

## 2019-07-24 DIAGNOSIS — O09523 Supervision of elderly multigravida, third trimester: Secondary | ICD-10-CM | POA: Diagnosis not present

## 2019-07-24 DIAGNOSIS — Z7984 Long term (current) use of oral hypoglycemic drugs: Secondary | ICD-10-CM | POA: Insufficient documentation

## 2019-07-24 NOTE — Progress Notes (Signed)
Pt informed that the ultrasound is considered a limited OB ultrasound and is not intended to be a complete ultrasound exam.  Patient also informed that the ultrasound is not being completed with the intent of assessing for fetal or placental anomalies or any pelvic abnormalities.  Explained that the purpose of today's ultrasound is to assess for  BPP, presentation, AFI and viability.  Patient acknowledges the purpose of the exam and the limitations of the study.    NST/BPP today-- patient will see Dr Rosana Hoes for [facility] visit.   BPP 8/8 today but NR NST with lates after contractions. Per Dr Rosana Hoes patient will go to MAU for extended monitoring. MAU called & notified and patient given directions.  Koren Bound RN BSN 07/24/19

## 2019-07-24 NOTE — Progress Notes (Signed)
   PRENATAL VISIT NOTE  Subjective:  Carol Harrington is a 38 y.o. [id number] at [redacted]w[redacted]d being seen today for ongoing prenatal care.  She is currently monitored for the following issues for this high-risk pregnancy and has Supervision of high risk pregnancy, antepartum; GDM, class A2; Advanced maternal age in multigravida; COVID-19 virus detected; and GBS (group B Streptococcus carrier), +RV culture, currently pregnant on their problem list.  Patient reports no complaints.  Contractions: Irregular. Vag. Bleeding: None.  Movement: Present. Denies leaking of fluid.   The following portions of the patient's history were reviewed and updated as appropriate: allergies, current medications, past family history, past medical history, past social history, past surgical history and problem list.   Objective:   Vitals:   07/24/19 0902  BP: 111/73  Pulse: 76  Weight: 130 lb (59 kg)    Fetal Status: Fetal Heart Rate (bpm): NST   Movement: Present     General:  Alert, oriented and cooperative. Patient is in no acute distress.  Skin: Skin is warm and dry. No rash noted.   Cardiovascular: Normal heart rate noted  Respiratory: Normal respiratory effort, no problems with respiration noted  Abdomen: Soft, gravid, appropriate for gestational age.  Pain/Pressure: Present     Pelvic: Cervical exam deferred        Extremities: Normal range of motion.  Edema: None  Mental Status: Normal mood and affect. Normal behavior. Normal judgment and thought content.   Assessment and Plan:  Pregnancy: [id number] at [redacted]w[redacted]d  1. Supervision of high risk pregnancy, antepartum  2. Multigravida of advanced maternal age in third trimester  3. COVID-19 virus detected June 2020  4. GBS (group B Streptococcus carrier), +RV culture, currently pregnant ppx in labor  5. GDM, class A2 IOL scheduled for 39 weeks FG: all < 95 PP: highest 125 BPP 8/10, -2 for NST which has two subtle late decels, to MAU for extended monitoring  MAU  staff aware  Term labor symptoms and general obstetric precautions including but not limited to vaginal bleeding, contractions, leaking of fluid and fetal movement were reviewed in detail with the patient. Please refer to After Visit Summary for other counseling recommendations.   Return in about 5 weeks (around 08/28/2019) for post partum check.  Future Appointments  Date Time Provider [place]  07/27/2019 12:00 AM [facility]-LD SCHED ROOM [facility]-INDC None    Sloan Leiter, MD

## 2019-07-24 NOTE — MAU Provider Note (Signed)
Chief Complaint:  Fetal Monitoring   First Provider Initiated Contact with Patient 07/24/19 1113      HPI: Carol Harrington is a 38 y.o. [id number] at 93w4dpt of [place] for A2DM on 07/26/19 presents to maternity admissions from the office for extended monitoring due to subtle late decelerations on NST in the office. BPP today was 8/8 so 8/10 with -2 for NST.  Pt reports irregular but uncomfortable contractions today. Pain is low in her abdomen and low back, intermittent cramping pain.  She has normal fetal movement.  She has not tried any treatments. There are no other symptoms.   HPI  Past Medical History: Past Medical History:  Diagnosis Date  . Diabetes mellitus without complication ([place]   . Gestational diabetes     Past obstetric history: OB History  Gravida Para Term Preterm AB Living  _0 0 1 4  SAB TAB Ectopic Multiple Live Births  1 0 0 0 4    # Outcome Date GA Lbr Len/2nd Weight Sex Delivery Anes PTL Lv  6 Current           5 Term 02/14/11 [id number] g F Vag-Spont   LIV  4 Term 03/23/09 3813w0d722 g F Vag-Spont   LIV  3 Term 02/21/05 40w90w0d75 g M Vag-Spont   LIV  2 Term 10/27/01 40w078w0d2 g M Vag-Spont   LIV  1 SAB             Past Surgical History: Past Surgical History:  Procedure Laterality Date  . NO PAST SURGERIES      Family History: History reviewed. No pertinent family history.  Social History: Social History   Tobacco Use  . Smoking status: Never Smoker  . Smokeless tobacco: Never Used  Substance Use Topics  . Alcohol use: No  . Drug use: No    Allergies: No Known Allergies  Meds:  No medications prior to admission.    ROS:  Review of Systems  Constitutional: Negative for chills, fatigue and fever.  Eyes: Negative for visual disturbance.  Respiratory: Negative for shortness of breath.   Cardiovascular: Negative for chest pain.  Gastrointestinal: Positive for abdominal pain. Negative for nausea and vomiting.  Genitourinary:  Negative for difficulty urinating, dysuria, flank pain, pelvic pain, vaginal bleeding, vaginal discharge and vaginal pain.  Musculoskeletal: Positive for back pain.  Neurological: Negative for dizziness and headaches.  Psychiatric/Behavioral: Negative.      I have reviewed patient's Past Medical Hx, Surgical Hx, Family Hx, Social Hx, medications and allergies.   Physical Exam   Patient Vitals for the past 24 hrs:  BP Temp Temp src Pulse Resp SpO2 Height Weight  07/24/19 1217 120/76 98.5 F (36.9 C) Oral 80 20 99 % - -  07/24/19 1015 117/77 97.9 F (36.6 C) Oral 85 18 99 % _1  (1.499 m) 59 kg   Constitutional: Well-developed, well-nourished female in no acute distress.  Cardiovascular: normal rate Respiratory: normal effort GI: Abd soft, non-tender, gravid appropriate for gestational age.  MS: Extremities nontender, no edema, normal ROM Neurologic: Alert and oriented x 4.  GU: Neg CVAT.   Dilation: 1 Effacement (%): Thick Station: -2 Presentation: Vertex Exam by:: F. Morris, RNC  FHT:  Baseline 135 , moderate variability, accelerations present, no decelerations Contractions: q 5-7 mins, mild to palpation   Labs: No results found for this or any previous visit (from the past 24 hour(s)). O/Positive/-- (04/13 0000)  Imaging:    MAU Course/MDM: Orders Placed This Encounter  Procedures  . Discharge patient    No orders of the defined types were placed in this encounter.    NST reviewed and reactive Consult Dr Rip Harbour with presentation, exam findings and test results.  With 8/8 BPP and NST reactive in MAU, pt discharged home with continued antenatal testing, induction as scheduled  Pt discharge with strict labor precautions/fetal kick counting reviewed.    Assessment: 1. NST (non-stress test) reactive   2. Braxton Hicks contractions     Plan: Discharge home Labor precautions and fetal kick counts Follow-up [place] for [facility] Follow up.   Specialty: Obstetrics and Gynecology Why: As scheduled, return to MAU as needed for emergencies. Contact information: [address], [place] [id number] [place] [address] [phone]         Allergies as of 07/24/2019   No Known Allergies     Medication List    TAKE these medications   Accu-Chek FastClix Lancets Misc 1 Device by Percutaneous route 4 (four) times daily.   Accu-Chek Guide Me w/Device Kit 1 Device by Does not apply route daily.   AMBULATORY NON FORMULARY MEDICATION 1 Device by Other route once a week. Blood Pressure Cuff Medium Monitored Regularly at home ICD 10:Z34.90   aspirin EC 81 MG tablet Take 1 tablet (81 mg total) by mouth daily. Take after 12 weeks for prevention of preeclampsia later in pregnancy   glucose blood test strip Commonly known as: Accu-Chek Guide Use as instructed QID   metFORMIN 500 MG tablet Commonly known as: GLUCOPHAGE Take 1 tablet (500 mg total) by mouth at bedtime.   prenatal multivitamin Tabs tablet Take 1 tablet by mouth daily at 12 noon.       Fatima Blank Certified Nurse-Midwife 07/24/2019 12:31 PM

## 2019-07-24 NOTE — MAU Note (Signed)
Pt sent from HD for EFM secondary late decelerations noted on NST.  Denies VB or LOF.  Reports +FM.

## 2019-07-25 ENCOUNTER — Other Ambulatory Visit (HOSPITAL_COMMUNITY): Payer: Medicaid Other

## 2019-07-27 ENCOUNTER — Encounter (HOSPITAL_COMMUNITY): Payer: Self-pay

## 2019-07-27 ENCOUNTER — Other Ambulatory Visit: Payer: Self-pay

## 2019-07-27 ENCOUNTER — Inpatient Hospital Stay (HOSPITAL_COMMUNITY): Payer: Medicaid Other

## 2019-07-27 ENCOUNTER — Inpatient Hospital Stay (HOSPITAL_COMMUNITY)
Admission: AD | Admit: 2019-07-27 | Discharge: 2019-07-28 | DRG: 807 | Disposition: A | Discharge status: Home / Self Care | Payer: Medicaid Other | Attending: Family Medicine | Admitting: Family Medicine

## 2019-07-27 DIAGNOSIS — Z3A39 39 weeks gestation of pregnancy: Secondary | ICD-10-CM

## 2019-07-27 DIAGNOSIS — O24424 Gestational diabetes mellitus in childbirth, insulin controlled: Secondary | ICD-10-CM | POA: Diagnosis not present

## 2019-07-27 DIAGNOSIS — O99820 Streptococcus B carrier state complicating pregnancy: Secondary | ICD-10-CM

## 2019-07-27 DIAGNOSIS — O24425 Gestational diabetes mellitus in childbirth, controlled by oral hypoglycemic drugs: Principal | ICD-10-CM | POA: Diagnosis present

## 2019-07-27 DIAGNOSIS — O24419 Gestational diabetes mellitus in pregnancy, unspecified control: Secondary | ICD-10-CM

## 2019-07-27 DIAGNOSIS — O99824 Streptococcus B carrier state complicating childbirth: Secondary | ICD-10-CM | POA: Diagnosis present

## 2019-07-27 DIAGNOSIS — Z20828 Contact with and (suspected) exposure to other viral communicable diseases: Secondary | ICD-10-CM | POA: Diagnosis present

## 2019-07-27 DIAGNOSIS — Z30430 Encounter for insertion of intrauterine contraceptive device: Secondary | ICD-10-CM

## 2019-07-27 DIAGNOSIS — O09529 Supervision of elderly multigravida, unspecified trimester: Secondary | ICD-10-CM

## 2019-07-27 LAB — CBC
HCT: 38.7 % (ref 36.0–46.0)
Hemoglobin: 14.2 g/dL (ref 12.0–15.0)
MCH: 29 pg (ref 26.0–34.0)
MCHC: 36.7 g/dL — ABNORMAL HIGH (ref 30.0–36.0)
MCV: 79.1 fL — ABNORMAL LOW (ref 80.0–100.0)
Platelets: 170 10*3/uL (ref 150–400)
RBC: 4.89 MIL/uL (ref 3.87–5.11)
RDW: 14.1 % (ref 11.5–15.5)
WBC: 10.2 10*3/uL (ref 4.0–10.5)
nRBC: 0 % (ref 0.0–0.2)

## 2019-07-27 LAB — ABO/RH: ABO/RH(D): O POS

## 2019-07-27 LAB — GLUCOSE, CAPILLARY
Glucose-Capillary: 106 mg/dL — ABNORMAL HIGH (ref 70–99)
Glucose-Capillary: 93 mg/dL (ref 70–99)
Glucose-Capillary: 89 mg/dL (ref 70–99)

## 2019-07-27 LAB — SARS CORONAVIRUS 2 (TAT 6-24 HRS): SARS Coronavirus 2: NEGATIVE

## 2019-07-27 LAB — TYPE AND SCREEN
ABO/RH(D): O POS
Antibody Screen: NEGATIVE

## 2019-07-27 LAB — RPR: RPR Ser Ql: NONREACTIVE

## 2019-07-27 MED ORDER — OXYTOCIN 40 UNITS IN NORMAL SALINE INFUSION - SIMPLE MED: 2.5000 [IU]/h | INTRAVENOUS | Status: DC

## 2019-07-27 MED ORDER — ONDANSETRON HCL 4 MG/2ML IJ SOLN: 4.0000 mg | Freq: Four times a day (QID) | INTRAMUSCULAR | Status: DC | PRN

## 2019-07-27 MED ORDER — WITCH HAZEL-GLYCERIN EX PADS: 1.0000 "application " | MEDICATED_PAD | CUTANEOUS | Status: DC | PRN

## 2019-07-27 MED ORDER — INFLUENZA VAC SPLIT QUAD 0.5 ML IM SUSY: 0.5000 mL | PREFILLED_SYRINGE | INTRAMUSCULAR | Status: DC

## 2019-07-27 MED ORDER — COCONUT OIL OIL: 1.0000 "application " | TOPICAL_OIL | Status: DC | PRN

## 2019-07-27 MED ORDER — MEASLES, MUMPS & RUBELLA VAC IJ SOLR: 0.5000 mL | Freq: Once | INTRAMUSCULAR | Status: DC

## 2019-07-27 MED ORDER — FENTANYL CITRATE (PF) 100 MCG/2ML IJ SOLN
100.0000 ug | INTRAMUSCULAR | Status: DC | PRN
  Administered 2019-07-27: 100 ug via INTRAVENOUS

## 2019-07-27 MED ORDER — BENZOCAINE-MENTHOL 20-0.5 % EX AERO
1.0000 "application " | INHALATION_SPRAY | CUTANEOUS | Status: DC | PRN
  Administered 2019-07-27: 1 via TOPICAL
  Filled 2019-07-27: qty 56

## 2019-07-27 MED ORDER — LEVONORGESTREL 19.5 MCG/DAY IU IUD
INTRAUTERINE_SYSTEM | Freq: Once | INTRAUTERINE | Status: AC
  Administered 2019-07-27: 1 via INTRAUTERINE
  Filled 2019-07-27: qty 1

## 2019-07-27 MED ORDER — MISOPROSTOL 100 MCG PO TABS
25.0000 ug | ORAL_TABLET | ORAL | Status: DC
  Administered 2019-07-27: 25 ug via VAGINAL
  Filled 2019-07-27 (×2): qty 1

## 2019-07-27 MED ORDER — OXYTOCIN 40 UNITS IN NORMAL SALINE INFUSION - SIMPLE MED
1.0000 m[IU]/min | INTRAVENOUS | Status: DC
  Administered 2019-07-27: 2 m[IU]/min via INTRAVENOUS
  Filled 2019-07-27: qty 1000

## 2019-07-27 MED ORDER — ZOLPIDEM TARTRATE 5 MG PO TABS: 5.0000 mg | ORAL_TABLET | Freq: Every evening | ORAL | Status: DC | PRN

## 2019-07-27 MED ORDER — PRENATAL MULTIVITAMIN CH
1.0000 | ORAL_TABLET | Freq: Every day | ORAL | Status: DC
  Administered 2019-07-28: 1 via ORAL
  Filled 2019-07-27: qty 1

## 2019-07-27 MED ORDER — ACETAMINOPHEN 325 MG PO TABS: 650.0000 mg | ORAL_TABLET | ORAL | Status: DC | PRN

## 2019-07-27 MED ORDER — IBUPROFEN 600 MG PO TABS
600.0000 mg | ORAL_TABLET | Freq: Four times a day (QID) | ORAL | Status: DC
  Administered 2019-07-27 – 2019-07-28 (×4): 600 mg via ORAL
  Filled 2019-07-27 (×4): qty 1

## 2019-07-27 MED ORDER — LIDOCAINE HCL (PF) 1 % IJ SOLN: 30.0000 mL | INTRAMUSCULAR | Status: DC | PRN

## 2019-07-27 MED ORDER — SODIUM CHLORIDE 0.9 % IV SOLN
5.0000 10*6.[IU] | Freq: Once | INTRAVENOUS | Status: AC
  Administered 2019-07-27: 5 10*6.[IU] via INTRAVENOUS
  Filled 2019-07-27: qty 5

## 2019-07-27 MED ORDER — OXYCODONE-ACETAMINOPHEN 5-325 MG PO TABS: 2.0000 | ORAL_TABLET | ORAL | Status: DC | PRN

## 2019-07-27 MED ORDER — TETANUS-DIPHTH-ACELL PERTUSSIS 5-2.5-18.5 LF-MCG/0.5 IM SUSP: 0.5000 mL | Freq: Once | INTRAMUSCULAR | Status: DC

## 2019-07-27 MED ORDER — DIPHENHYDRAMINE HCL 25 MG PO CAPS: 25.0000 mg | ORAL_CAPSULE | Freq: Four times a day (QID) | ORAL | Status: DC | PRN

## 2019-07-27 MED ORDER — DIBUCAINE (PERIANAL) 1 % EX OINT: 1.0000 "application " | TOPICAL_OINTMENT | CUTANEOUS | Status: DC | PRN

## 2019-07-27 MED ORDER — PENICILLIN G 3 MILLION UNITS IVPB - SIMPLE MED
3.0000 10*6.[IU] | INTRAVENOUS | Status: DC
  Administered 2019-07-27 (×2): 3 10*6.[IU] via INTRAVENOUS
  Filled 2019-07-27 (×2): qty 100

## 2019-07-27 MED ORDER — FLEET ENEMA 7-19 GM/118ML RE ENEM: 1.0000 | ENEMA | RECTAL | Status: DC | PRN

## 2019-07-27 MED ORDER — ONDANSETRON HCL 4 MG/2ML IJ SOLN: 4.0000 mg | INTRAMUSCULAR | Status: DC | PRN

## 2019-07-27 MED ORDER — SENNOSIDES-DOCUSATE SODIUM 8.6-50 MG PO TABS
2.0000 | ORAL_TABLET | ORAL | Status: DC
  Administered 2019-07-27: 2 via ORAL
  Filled 2019-07-27: qty 2

## 2019-07-27 MED ORDER — FENTANYL CITRATE (PF) 100 MCG/2ML IJ SOLN
INTRAMUSCULAR | Status: AC
  Filled 2019-07-27: qty 2

## 2019-07-27 MED ORDER — SIMETHICONE 80 MG PO CHEW: 80.0000 mg | CHEWABLE_TABLET | ORAL | Status: DC | PRN

## 2019-07-27 MED ORDER — OXYCODONE-ACETAMINOPHEN 5-325 MG PO TABS: 1.0000 | ORAL_TABLET | ORAL | Status: DC | PRN

## 2019-07-27 MED ORDER — SOD CITRATE-CITRIC ACID 500-334 MG/5ML PO SOLN: 30.0000 mL | ORAL | Status: DC | PRN

## 2019-07-27 MED ORDER — LACTATED RINGERS IV SOLN: 500.0000 mL | INTRAVENOUS | Status: DC | PRN

## 2019-07-27 MED ORDER — LACTATED RINGERS IV SOLN
INTRAVENOUS | Status: DC
  Administered 2019-07-27 (×2): via INTRAVENOUS

## 2019-07-27 MED ORDER — TERBUTALINE SULFATE 1 MG/ML IJ SOLN: 0.2500 mg | Freq: Once | INTRAMUSCULAR | Status: DC | PRN

## 2019-07-27 MED ORDER — ONDANSETRON HCL 4 MG PO TABS: 4.0000 mg | ORAL_TABLET | ORAL | Status: DC | PRN

## 2019-07-27 MED ORDER — OXYTOCIN BOLUS FROM INFUSION: 500.0000 mL | Freq: Once | INTRAVENOUS | Status: DC

## 2019-07-27 NOTE — Discharge Summary (Signed)
Postpartum Discharge Summary      Patient Name: Carol Harrington DOB: 05-26-1981 MRN: [id number]  Date of admission: 07/27/2019 Delivering Provider: Donnamae Jude   Date of discharge: 07/28/2019  Admitting diagnosis: PREG Intrauterine pregnancy: [redacted]w[redacted]d    Secondary diagnosis:  Active Problems:   GDM, class A2   Advanced maternal age in multigravida   GBS (group B Streptococcus carrier), +RV culture, currently pregnant   Gestational diabetes  Additional problems: none     Discharge diagnosis: Term Pregnancy Delivered and GDM A2                                                                                                Post partum procedures:none  Augmentation: AROM, Pitocin, Cytotec and Foley Balloon  Complications: None  Hospital course:  Induction of Labor With Vaginal Delivery   38y.o. yo [id number] 315w0das admitted to the hospital 07/27/2019 for induction of labor.  Indication for induction: A2 DM and AMA.  Patient had an uncomplicated labor course as follows: Membrane Rupture Time/Date: 11:06 AM ,07/27/2019   Intrapartum Procedures: Episiotomy: None [1]                                         Lacerations:  None [1]  Patient had delivery of a Viable infant.  Information for the patient's newborn:  KpMaleigh, Bagotirl HjGoldie0[[id number]]Delivery Method: Vag-Spont    07/27/2019  Details of delivery can be found in separate delivery note.  Patient had a routine postpartum course. Patient is discharged home 07/28/19. Delivery time: 12:42 PM    Magnesium Sulfate received: No BMZ received: No Rhophylac:No MMR:No Transfusion:No  Physical exam  Vitals:   07/27/19 1722 07/27/19 2110 07/28/19 0023 07/28/19 0520  BP: 104/72 115/72 117/84 (!) 90/58  Pulse: 86 79 70 77  Resp:  _0 Temp: 99.8 F (37.7 C) 98.8 F (37.1 C)  97.8 F (36.6 C)  TempSrc: Axillary Oral  Oral  SpO2: 100% 99% 99% 99%  Weight:      Height:       General: alert, cooperative and no  distress Lochia: appropriate Uterine Fundus: firm Incision: N/A DVT Evaluation: No evidence of DVT seen on physical exam. Negative Homan's sign. No cords or calf tenderness. No significant calf/ankle edema. Labs: Lab Results  Component Value Date   WBC 10.2 07/27/2019   HGB 14.2 07/27/2019   HCT 38.7 07/27/2019   MCV 79.1 (L) 07/27/2019   PLT 170 07/27/2019   CMP Latest Ref Rng & Units 07/28/2019  Glucose 70 - 99 mg/dL 81  BUN - -  Creatinine - -  Sodium - -  Potassium - -  Chloride - -    Discharge instruction: per After Visit Summary and "Baby and Me Booklet".  After visit meds:  Allergies as of 07/28/2019   No Known Allergies     Medication List    STOP taking these medications   Accu-Chek FastClix Lancets Misc  Accu-Chek Guide Me w/Device Kit   glucose blood test strip Commonly known as: Accu-Chek Guide   metFORMIN 500 MG tablet Commonly known as: GLUCOPHAGE     TAKE these medications   AMBULATORY NON FORMULARY MEDICATION 1 Device by Other route once a week. Blood Pressure Cuff Medium Monitored Regularly at home ICD 10:Z34.90   aspirin EC 81 MG tablet Take 1 tablet (81 mg total) by mouth daily. Take after 12 weeks for prevention of preeclampsia later in pregnancy   ibuprofen 600 MG tablet Commonly known as: ADVIL Take 1 tablet (600 mg total) by mouth every 6 (six) hours.   prenatal multivitamin Tabs tablet Take 1 tablet by mouth daily at 12 noon.       Diet: carb modified diet  Activity: Advance as tolerated. Pelvic rest for 6 weeks.   Outpatient follow up:4 weeks Follow up Appt:No future appointments. Follow up Visit: [place] for [facility] Follow up in 4 week(s).   Specialty: Obstetrics and Gynecology Contact information: [address] [place], [place] [id number] [place] [address] [phone]           Please schedule this patient for Postpartum  visit in: 4 weeks with the following provider: Any provider For C/S patients schedule nurse incision check in weeks 2 weeks: no High risk pregnancy complicated by: GDM Delivery mode:  SVD Anticipated Birth Control:  IUD PP Procedures needed: 2 hour GTT  Schedule Integrated BH visit: no   Newborn Data: Live born female  Birth Weight:  7 lbs 0.5 oz (3189 gm) APGAR: 8/9  Newborn Delivery   Birth date/time: 07/27/2019 12:42:00 Delivery type: Vaginal, Spontaneous      Baby Feeding: Bottle and Breast Disposition:home with mother   07/28/2019 Laury Deep, CNM

## 2019-07-27 NOTE — Progress Notes (Signed)
Labor Progress Note Carol Harrington is a 38 y.o. [id number] at [redacted]w[redacted]d presented for IOL for GDMA2. S: Ctx continually increasing in strength and frequency.  O:  BP 116/75   Pulse 78   Temp 98.5 F (36.9 C) (Oral)   Resp 18   Ht 4\' 11"  (1.499 m)   Wt 61 kg   LMP 10/27/2018 (Exact Date)   BMI 27.17 kg/m  EFM: 145, reactive, moderate variability, pos accels, no decles Ctx: q2-2m  CVE: Dilation: 4.5 Effacement (%): 50 Station: -3 Presentation: Vertex Exam by:: Dr Marice Potter   A&P: 38 y.o. [id number] [redacted]w[redacted]d here for IOL for [place]. #Labor: S/p 1 Cytotec and FB now out. Good cervical change. Will start low dose Pit. Anticipate SVD. #FWB: Cat I #GBS positive; PCN since 0109 #GDMA2: On Metformin 500 mg qhs; not ordered on admission. CBG's q4h latent and a2h active. Last glucose 93.  Chauncey Mann, MD 6:39 AM

## 2019-07-27 NOTE — Progress Notes (Signed)
Labor Progress Note Carol Harrington is a 38 y.o. [id number] at [redacted]w[redacted]d presented for IOL for GDMA2. S: Not feeling ctx as strongly.   O:  BP 113/71   Pulse 68   Temp 97.9 F (36.6 C) (Oral)   Resp 18   Ht 4\' 11"  (1.499 m)   Wt 61 kg   LMP 10/27/2018 (Exact Date)   BMI 27.17 kg/m  EFM: 145, reactive, moderate variability, pos accels, no decels Ctx: q2-22m  CVE: Dilation: 4.5 Effacement (%): 50 Cervical Position: Posterior Station: -3 Presentation: Vertex Exam by:: Sallee Hogrefe, MD   A&P: 38 y.o. [id number] [redacted]w[redacted]d here for IOL for GDMA2. #Labor: S/p 1 Cytotec and FB. Cont Pit titration. AROM with clear fluid this exam. Anticipate SVD. #FWB: Cat I #GBS positive; PCN since 0109 #GDMA2: On Metformin 500 mg qhs; not ordered on admission. CBG's q4h latent and a2h active. Last glucose 89.  Chauncey Mann, MD 11:09 AM

## 2019-07-27 NOTE — Discharge Instructions (Signed)

## 2019-07-27 NOTE — H&P (Signed)
OBSTETRIC ADMISSION HISTORY AND PHYSICAL  Keeshia Reust is a 38 y.o. female [id number] with IUP at 8w0dby LMP presenting for IOL for [place] She reports +FMs, No LOF, no VB, no blurry vision, headaches or peripheral edema, and RUQ pain.  She plans on bottle and breast feeding. She requests IUD for birth control. [facility]  Dating: By LMP --->  Estimated Date of Delivery: 08/03/19  Sono:  9/30  '@[redacted]w[redacted]d' , CWD, normal anatomy, cephalic presentation, anterior placental lie, 2833g, 42% EFW  Prenatal History/Complications: GDMA2 on Metformin AMA COVID pos on 03/15/2019 GBS pos  Past Medical History: Past Medical History:  Diagnosis Date  . Diabetes mellitus without complication ([place]   . Gestational diabetes     Past Surgical History: Past Surgical History:  Procedure Laterality Date  . NO PAST SURGERIES      Obstetrical History: OB History    Gravida  6   Para  4   Term  4   Preterm  0   AB  1   Living  4     SAB  1   TAB  0   Ectopic  0   Multiple  0   Live Births  4           Social History: Social History   Socioeconomic History  . Marital status: Married    Spouse name: Not on file  . Number of children: Not on file  . Years of education: Not on file  . Highest education level: Not on file  Occupational History  . Not on file  Social Needs  . Financial resource strain: Not on file  . Food insecurity    Worry: Not on file    Inability: Not on file  . Transportation needs    Medical: Not on file    Non-medical: Not on file  Tobacco Use  . Smoking status: Never Smoker  . Smokeless tobacco: Never Used  Substance and Sexual Activity  . Alcohol use: No  . Drug use: No  . Sexual activity: Yes  Lifestyle  . Physical activity    Days per week: Not on file    Minutes per session: Not on file  . Stress: Not on file  Relationships  . Social [place] phone: Not on file    Gets together: Not on file   Attends religious service: Not on file    Active member of club or organization: Not on file    Attends meetings of clubs or organizations: Not on file    Relationship status: Not on file  Other Topics Concern  . Not on file  Social History Narrative  . Not on file    Family History: History reviewed. No pertinent family history.  Allergies: No Known Allergies  Medications Prior to Admission  Medication Sig Dispense Refill Last Dose  . aspirin EC 81 MG tablet Take 1 tablet (81 mg total) by mouth daily. Take after 12 weeks for prevention of preeclampsia later in pregnancy 300 tablet 2 07/26/2019 at Unknown time  . metFORMIN (GLUCOPHAGE) 500 MG tablet Take 1 tablet (500 mg total) by mouth at bedtime. 60 tablet 5 07/26/2019 at Unknown time  . Prenatal Vit-Fe Fumarate-FA (PRENATAL MULTIVITAMIN) TABS tablet Take 1 tablet by mouth daily at 12 noon.   07/26/2019 at Unknown time  . Accu-Chek FastClix Lancets MISC 1 Device by Percutaneous route 4 (four) times daily. 100 each 12   .  AMBULATORY NON FORMULARY MEDICATION 1 Device by Other route once a week. Blood Pressure Cuff Medium Monitored Regularly at home ICD 10:Z34.90 1 kit 0   . Blood Glucose Monitoring Suppl (ACCU-CHEK GUIDE ME) w/Device KIT 1 Device by Does not apply route daily. 1 kit 0   . glucose blood (ACCU-CHEK GUIDE) test strip Use as instructed QID 100 each 12      Review of Systems   All systems reviewed and negative except as stated in HPI  Blood pressure 133/83, pulse 81, temperature 99 F (37.2 C), temperature source Oral, resp. rate 20, height '4\' 11"'  (1.499 m), weight 61 kg, last menstrual period 10/27/2018. General appearance: alert, cooperative and appears stated age Lungs: normal effort Heart: regular rate  Abdomen: soft, non-tender; bowel sounds normal Pelvic: gravid uterus Extremities: Homans sign is negative, no sign of DVT Presentation: cephalic Fetal monitoringBaseline: 145 bpm, Variability: Good {> 6 bpm),  Accelerations: Reactive and Decelerations: Absent Uterine activityFrequency: Every 5 minutes Dilation: 2 Effacement (%): 20 Station: Ballotable Exam by:: Javier Docker RN   Prenatal labs: ABO, Rh: --/--/PENDING (10/18 2376) Antibody: PENDING (10/18 0035) Rubella: Immune (04/13 0000) RPR: Non Reactive (07/28 1201)  HBsAg: Negative (04/13 0000)  HIV: Non-reactive (04/13 0000)  GBS: Positive/-- (09/30 1150)  1 and 3 hr Glucola abnormal Genetic screening  Not done Anatomy US 03/06/2019  Prenatal Transfer Tool  Maternal Diabetes: Yes:  Diabetes Type:  Insulin/Medication controlled Genetic Screening: Declined Maternal Ultrasounds/Referrals: Normal Fetal Ultrasounds or other Referrals:  Fetal echo ordered but results not seen; normal per patient Maternal Substance Abuse:  No Significant Maternal Medications:  None Significant Maternal Lab Results: Group B Strep positive  Results for orders placed or performed during the hospital encounter of 07/27/19 (from the past 24 hour(s))  CBC   Collection Time: 07/27/19 12:35 AM  Result Value Ref Range   WBC 10.2 4.0 - 10.5 K/uL   RBC 4.89 3.87 - 5.11 MIL/uL   Hemoglobin 14.2 12.0 - 15.0 g/dL   HCT 38.7 36.0 - 46.0 %   MCV 79.1 (L) 80.0 - 100.0 fL   MCH 29.0 26.0 - 34.0 pg   MCHC 36.7 (H) 30.0 - 36.0 g/dL   RDW 14.1 11.5 - 15.5 %   Platelets 170 150 - 400 K/uL   nRBC 0.0 0.0 - 0.2 %  Type and screen   Collection Time: 07/27/19 12:35 AM  Result Value Ref Range   ABO/RH(D) PENDING    Antibody Screen PENDING    Sample Expiration      07/30/2019,2359 Performed at [facility], [place] [address] [place], [place] [address]   Glucose, capillary   Collection Time: 07/27/19  1:12 AM  Result Value Ref Range   Glucose-Capillary 106 (H) 70 - 99 mg/dL    Patient Active Problem List   Diagnosis Date Noted  . Gestational diabetes 07/27/2019  . GBS (group B Streptococcus carrier), +RV culture, currently pregnant 07/13/2019  . COVID-19  virus detected 03/18/2019  . Supervision of high risk pregnancy, antepartum 02/12/2019  . GDM, class A2 02/12/2019  . Advanced maternal age in multigravida 02/12/2019    Assessment/Plan:  Shanta Hartner is a 38 y.o. [id number] at 2w0dhere for IOL for [place]  #Labor: Will induce with Cytotec. Anticipate SVD.  #Pain: IV pain meds; epidural if desires #FWB: Cat I; EFW: 3500g #ID:  GBS pos, PCN ordered #MOF: Both #MOC: IUD (ordered and consented) #Circ:  declines #GDMA2: On Metformin 500 mg qhs; not ordered on  admission. CBG's q4h latent and a2h active  Barrington Ellison, MD [place] Fellow, [facility] for [facility] 07/27/2019, 1:17 AM

## 2019-07-27 NOTE — Progress Notes (Addendum)
Labor Progress Note Carol Harrington is a 38 y.o. [id number] at [redacted]w[redacted]d presented for IOL for GDMA2. S: Rating ctx 6/10.  O:  BP 116/75   Pulse 78   Temp 98.5 F (36.9 C) (Oral)   Resp 18   Ht 4\' 11"  (1.499 m)   Wt 61 kg   LMP 10/27/2018 (Exact Date)   BMI 27.17 kg/m  EFM: 145, reactive, moderate variability, pos accels, no decles Ctx: 5-6/10 minutes  CVE: Dilation: 2 Effacement (%): 20 Station: Ballotable Presentation: Vertex Exam by:: Javier Docker RN   A&P: 38 y.o. [id number] [redacted]w[redacted]d here for IOL for GDMA2. #Labor: S/p Cytotec x1 at [place]. Unchanged cervical exam 4 hours after first Cytotec. Contracting too frequently right now for another Cytotec. Will continue to monitor and see if ctx space out or if patient will start laboring on her own. FB placed at 0540. Anticipate SVD. #Pain: per patient request #FWB: Cat I #GBS positive; PCN since 0109 #GDMA2: On Metformin 500 mg qhs; not ordered on admission. CBG's q4h latent and a2h active. Last glucose 93.  Chauncey Mann, MD 5:31 AM

## 2019-07-28 LAB — GLUCOSE, CAPILLARY: Glucose-Capillary: 141 mg/dL — ABNORMAL HIGH (ref 70–99)

## 2019-07-28 LAB — GLUCOSE, RANDOM: Glucose, Bld: 81 mg/dL (ref 70–99)

## 2019-07-28 MED ORDER — IBUPROFEN 600 MG PO TABS: 600.0000 mg | ORAL_TABLET | Freq: Four times a day (QID) | ORAL | 0 refills | Status: AC

## 2019-07-28 NOTE — Lactation Note (Signed)
This note was copied from a baby's chart. Lactation Consultation Note  Patient Name: Carol Harrington [id number] Date: 07/28/2019 Reason for consult: Initial assessment;Term  P5 mother whose infant is now 41 hours old.  Mother breast fed all her other children.  The length of time varied but the longest she breast fed one child was 2 years.  Mother's feeding preference on admission was breast/bottle.  Mother had baby latched in the cradle hold on the right breast when I arrived.  Mother was sitting in an awkward position with legs tucked up under her but she was comfortable.  She had no questions/concerns related to breast feeding.  She informed me that she had no difficulty feeding any of her children.  Encouraged to feed 8-12 times/24 hours or sooner if baby shows feeding cues.  Reviewed cues.  Asked mother about hand expression and she feels comfortable doing this.  Suggested she perform hand expression before/after feeding to help increase milk supply.    Mom made aware of O/P services, breastfeeding support groups, community resources, and our phone # for post-discharge questions.  Mother has a DEBP for home use.  No support person present at this time.  Mother will call for assistance as needed.   Maternal Data Formula Feeding for Exclusion: Yes Reason for exclusion: Mother's choice to formula and breast feed on admission Has patient been taught Hand Expression?: Yes Does the patient have breastfeeding experience prior to this delivery?: Yes  Feeding Feeding Type: Breast Fed  LATCH Score Latch: Grasps breast easily, tongue down, lips flanged, rhythmical sucking.  Audible Swallowing: None  Type of Nipple: Everted at rest and after stimulation  Comfort (Breast/Nipple): Soft / non-tender  Hold (Positioning): No assistance needed to correctly position infant at breast.  LATCH Score: 8  Interventions Interventions: Breast feeding basics reviewed;Skin to skin  Lactation Tools  Discussed/Used     Consult Status Consult Status: Follow-up Date: 07/29/19 Follow-up type: In-patient    Akashdeep Chuba R Pang Robers 07/28/2019, 11:40 AM

## 2019-07-28 NOTE — Progress Notes (Signed)
POSTPARTUM PROGRESS NOTE  POD #00  Subjective:  Carol Harrington is a 38 y.o. [id number] s/p induced VD at [redacted]w[redacted]d Pt presented for IOL for GDMA2. NAEO.  She reports she doing well. She has been able to ambulate to and from the bathroom. Patient states that she has slight hematuria, no clots, has not had a BM but has flatus. She denies any problems with ambulating, voiding or po intake. Denies headaches, chest pain, nausea or vomiting. Pain is mild and mostly located around perineal area and is controlled.  Lochia is present and light without presence of any clots. Patient is breastfeeding and bottle feeding.   Objective: Vitals:   07/28/19 0023 07/28/19 0520  BP: 117/84 (!) 90/58  Pulse: 70 77  Resp: 18 16  Temp:  97.8 F (36.6 C)  SpO2: 99% 99%    Physical Exam:  General: alert, cooperative and no distress Chest: no respiratory distress Heart:regular rate, distal pulses intact Abdomen: soft, nontender,  Uterine Fundus: firm, appropriately tender DVT Evaluation: No calf swelling or tenderness Extremities: No edema Skin: warm, dry; cap refill <3secs  Recent Labs    07/27/19 0035  HGB 14.2  HCT 38.7    Assessment/Plan: Carol Harrington is a 38 y.o. [id number] s/p Vaginal delivery at [redacted]w[redacted]d for IOL due to gDMA2  POD#00 - Doing welll; pain controlled. H/H appropriate  Routine postpartum care  Ambulating without issue  Contraception: IUD placed  Patient can be discharged today Feeding: Breast and bottle   Dispo: Plan for discharge within 24hrs   LOS: 1 day   Sherrian Divers MS3 07/28/2019, 9:29 AM

## 2019-08-11 ENCOUNTER — Encounter: Payer: Self-pay | Admitting: Advanced Practice Midwife

## 2019-08-11 DIAGNOSIS — Z975 Presence of (intrauterine) contraceptive device: Secondary | ICD-10-CM | POA: Insufficient documentation

## 2019-08-11 HISTORY — DX: Presence of (intrauterine) contraceptive device: Z97.5

## 2019-08-28 ENCOUNTER — Other Ambulatory Visit: Payer: Self-pay | Admitting: *Deleted

## 2019-08-28 DIAGNOSIS — O2492 Unspecified diabetes mellitus in childbirth: Secondary | ICD-10-CM

## 2019-09-01 ENCOUNTER — Other Ambulatory Visit: Payer: Medicaid Other

## 2019-09-01 ENCOUNTER — Other Ambulatory Visit: Payer: Self-pay

## 2019-09-01 ENCOUNTER — Ambulatory Visit (INDEPENDENT_AMBULATORY_CARE_PROVIDER_SITE_OTHER): Payer: Medicaid Other | Admitting: Student

## 2019-09-01 ENCOUNTER — Encounter: Payer: Self-pay | Admitting: Student

## 2019-09-01 DIAGNOSIS — Z1389 Encounter for screening for other disorder: Secondary | ICD-10-CM | POA: Diagnosis not present

## 2019-09-01 DIAGNOSIS — O2492 Unspecified diabetes mellitus in childbirth: Secondary | ICD-10-CM | POA: Diagnosis not present

## 2019-09-01 DIAGNOSIS — Z23 Encounter for immunization: Secondary | ICD-10-CM | POA: Diagnosis not present

## 2019-09-01 DIAGNOSIS — O0990 Supervision of high risk pregnancy, unspecified, unspecified trimester: Secondary | ICD-10-CM

## 2019-09-01 DIAGNOSIS — Z3040 Encounter for surveillance of contraceptives, unspecified: Secondary | ICD-10-CM

## 2019-09-01 DIAGNOSIS — Z30430 Encounter for insertion of intrauterine contraceptive device: Secondary | ICD-10-CM

## 2019-09-01 MED ORDER — LEVONORGESTREL 19.5 MCG/DAY IU IUD
INTRAUTERINE_SYSTEM | Freq: Once | INTRAUTERINE | Status: AC
  Administered 2019-09-01: 10:00:00 via INTRAUTERINE

## 2019-09-01 NOTE — Progress Notes (Signed)
Subjective:     Carol Harrington is a 38 y.o. female who presents for a postpartum visit. She is 5 weeks postpartum following a spontaneous vaginal delivery. I have fully reviewed the prenatal and intrapartum course. The delivery was at 39  gestational weeks. Outcome: spontaneous vaginal delivery. Anesthesia: none. Postpartum course has been uneventful. Baby's course has been uneventful. Baby is feeding by both breast and bottle - Carn[place] Good Start. Bleeding no bleeding. Bowel function is normal. Bladder function is normal. Patient is not sexually active. Contraception method is IUD. Postpartum depression screening: negative.  The following portions of the patient's history were reviewed and updated as appropriate: allergies, current medications, past family history, past medical history, past social history, past surgical history and problem list.  Review of Systems Pertinent items are noted in HPI.   Objective:    BP 103/78   Pulse 69   Ht 4\' 11"  (1.499 m)   Wt 117 lb 8 oz (53.3 kg)   BMI 23.73 kg/m   General:  alert, cooperative and no distress   Breasts:  inspection negative, no nipple discharge or bleeding, no masses or nodularity palpable  Lungs: clear to auscultation bilaterally  Heart:  regular rate and rhythm, S1, S2 normal, no murmur, click, rub or gallop  Abdomen: soft, non-tender; bowel sounds normal; no masses,  no organomegaly   Vulva:  normal  Vagina: normal vagina  Cervix:  normal appearing, IUD strings visualized and IUD tip protruding from the os.   Corpus: not examined  Adnexa:  no mass, fullness, tenderness  Rectal Exam: Normal rectovaginal exam        Assessment:    Healthy  postpartum exam. Pap smear not done at today's visit.   Plan:    1. Contraception: IUD 2.  IUD removed and reinserted; please see notes below.  -2 hour PP GTT in process 3. Follow up in: 4 week or as needed.     IUD Removal  Patient was in the dorsal lithotomy position, normal external  genitalia was noted.  A speculum was placed in the patient's vagina, normal discharge was noted, no lesions. The multiparous cervix was visualized, no lesions, no abnormal discharge.  The strings of the IUD were grasped and pulled using ring forceps. The IUD was removed in its entirety.   Patient tolerated the procedure well.      GYNECOLOGY CLINIC PROCEDURE NOTE  Carol Harrington is a 39 y.o. [id number] here for IUD insertion. No GYN concerns.  Last pap smear was on April 2020 and was normal.  IUD Insertion Procedure Note Patient identified, informed consent performed, consent signed.   Discussed risks of irregular bleeding, cramping, infection, malpositioning or misplacement of the IUD outside the uterus which may require further procedure such as laparoscopy. Time out was performed.  Urine pregnancy test was not done as patient is abstinent and had IUD placed pp.  Speculum placed in the vagina.  Cervix visualized.  Cleaned with Betadine x 2.  Grasped anteriorly with a single tooth tenaculum.  Uterus sounded to 7 cm.   IUD placed per manufacturer's recommendations.  Strings trimmed to 3 cm. Tenaculum was removed, good hemostasis noted.  Patient tolerated procedure well.   Patient was given post-procedure instructions.  She was advised to have backup contraception for one week.  Patient was also asked to check IUD strings periodically and follow up in 4 weeks for IUD check.

## 2019-09-02 LAB — GLUCOSE TOLERANCE, 2 HOURS
Glucose, 2 hour: 140 mg/dL — ABNORMAL HIGH (ref 65–139)
Glucose, GTT - Fasting: 82 mg/dL (ref 65–99)

## 2019-09-08 ENCOUNTER — Telehealth (INDEPENDENT_AMBULATORY_CARE_PROVIDER_SITE_OTHER): Payer: Medicaid Other

## 2019-09-08 ENCOUNTER — Encounter: Payer: Self-pay | Admitting: *Deleted

## 2019-09-08 DIAGNOSIS — O2492 Unspecified diabetes mellitus in childbirth: Secondary | ICD-10-CM

## 2019-09-08 NOTE — Telephone Encounter (Addendum)
Per Maye Hides, CNM pt needs to be informed that she failed her pp two hour.  LM for pt that I am calling with results if she could please call the office or send MyChart message.    Notified pt of results and that I have sent a referral to [facility] on [address] so that they will manage her diabetes.  Pt also given info to [facility] office and encouraged to f/u with the office if she has not heard from them within a week or two.  Pt verbalized understanding.

## 2019-09-12 IMAGING — US US FETAL BPP W/ NON-STRESS
1 series · 13 of 15 positions shown · non-contrast
Comparison: none

[Series 1: us fetal bpp w/nonstress · 15 acquisitions, 13 frames shown]
[im 1/15]
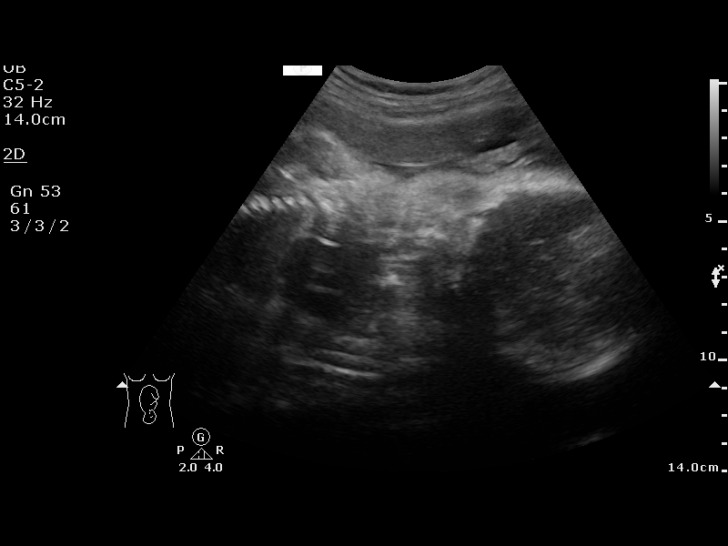
[im 2/15]
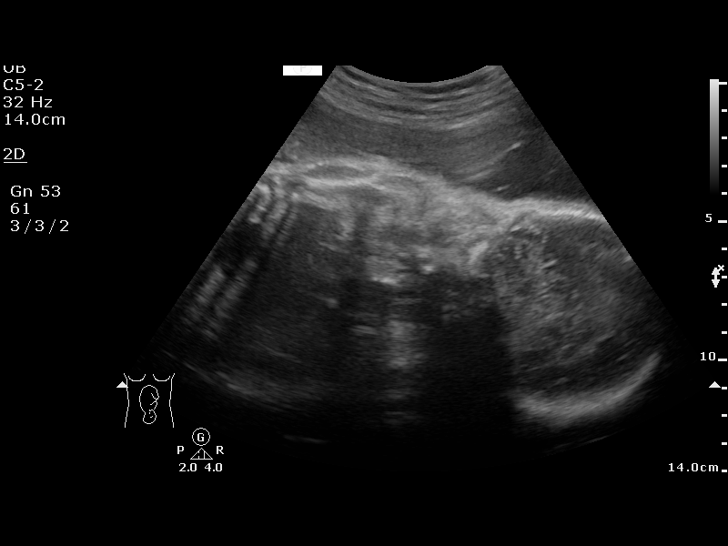
[im 3/15]
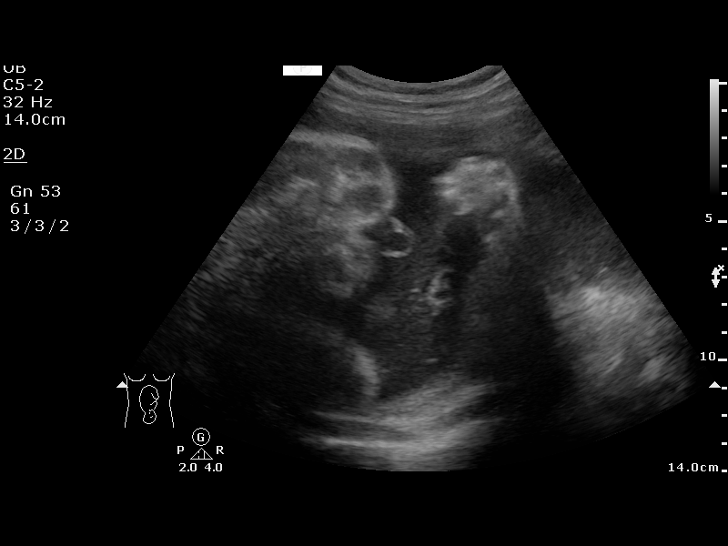
[im 5/15]
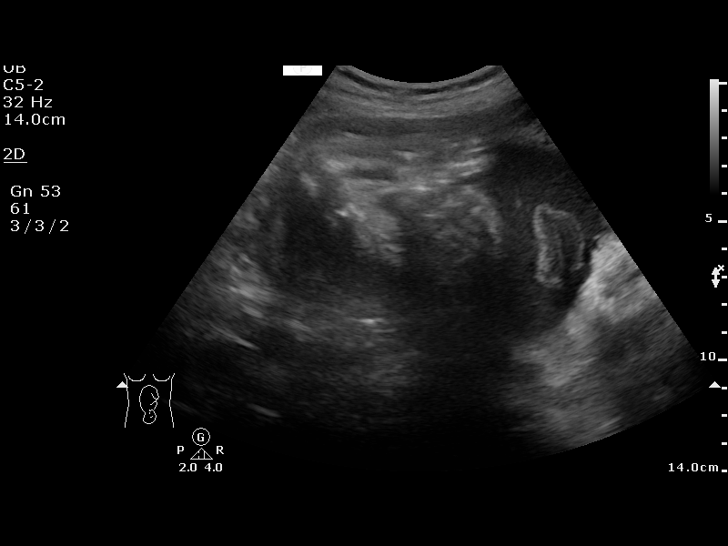
[im 6/15]
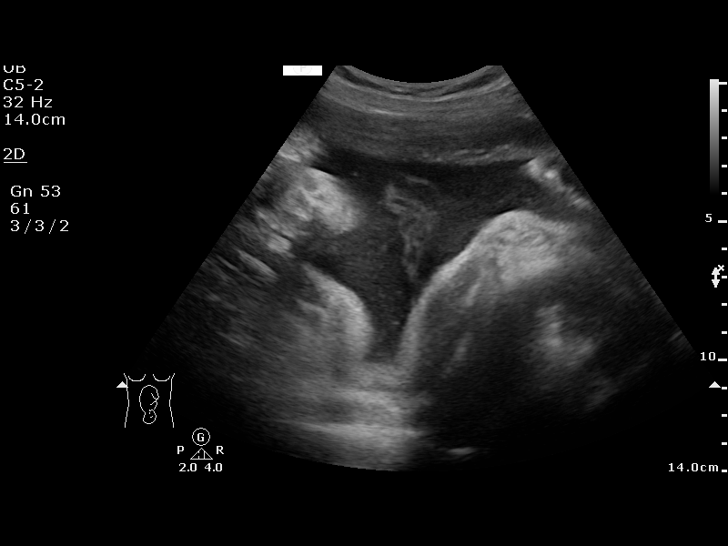
[im 7/15]
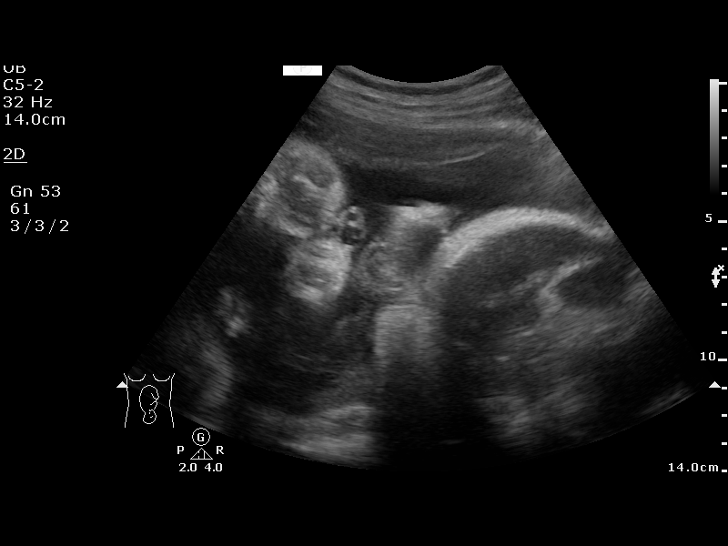
[im 8/15]
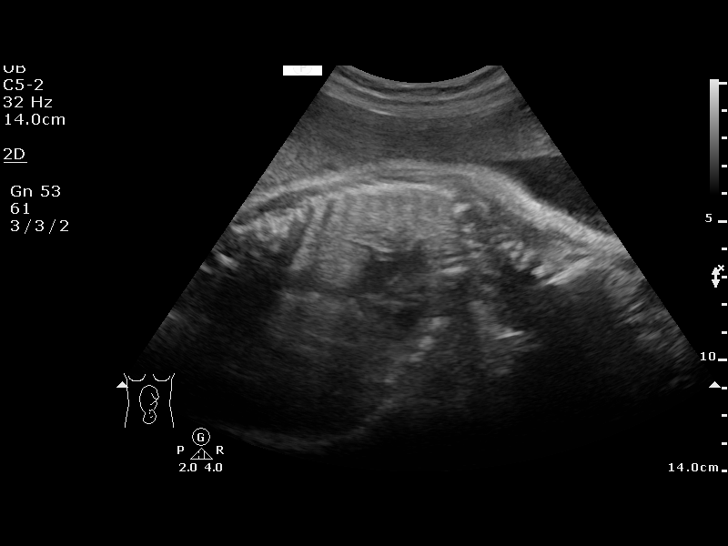
[im 9/15]
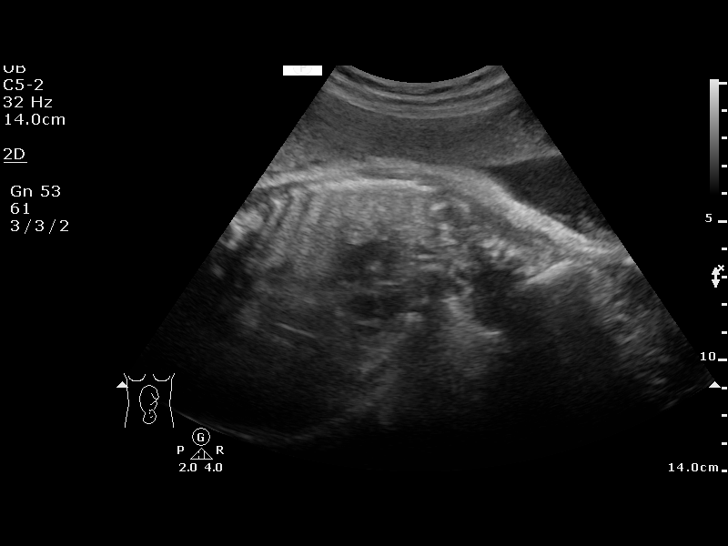
[im 10/15]
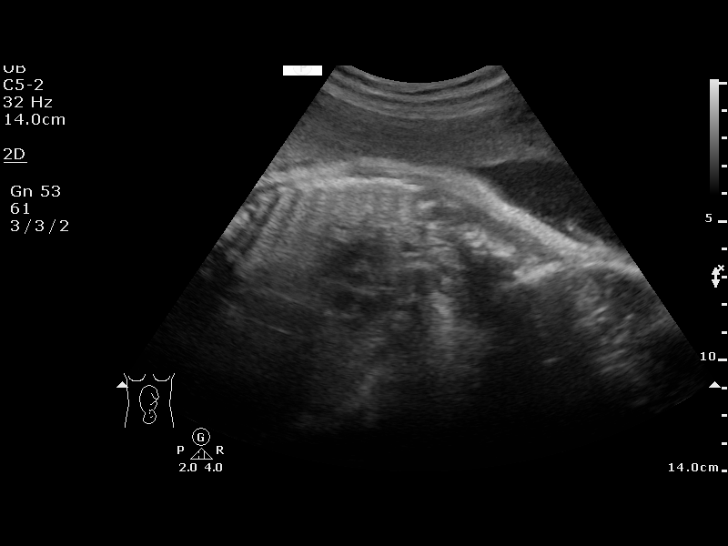
[im 11/15]
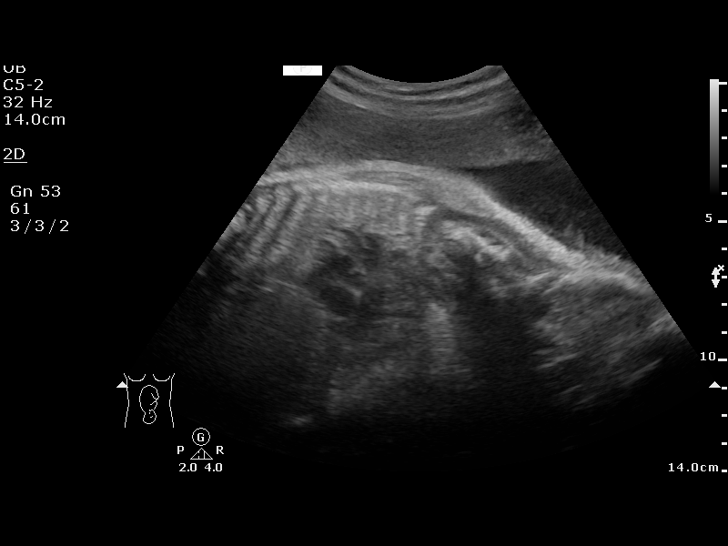
[im 13/15]
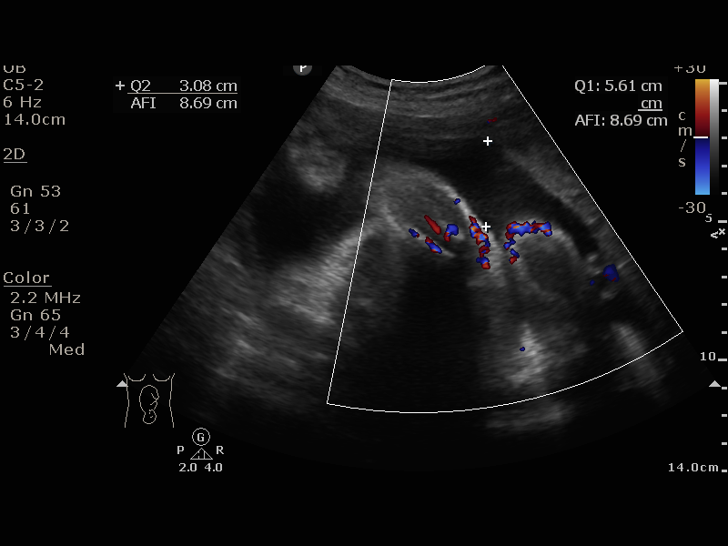
[im 14/15]
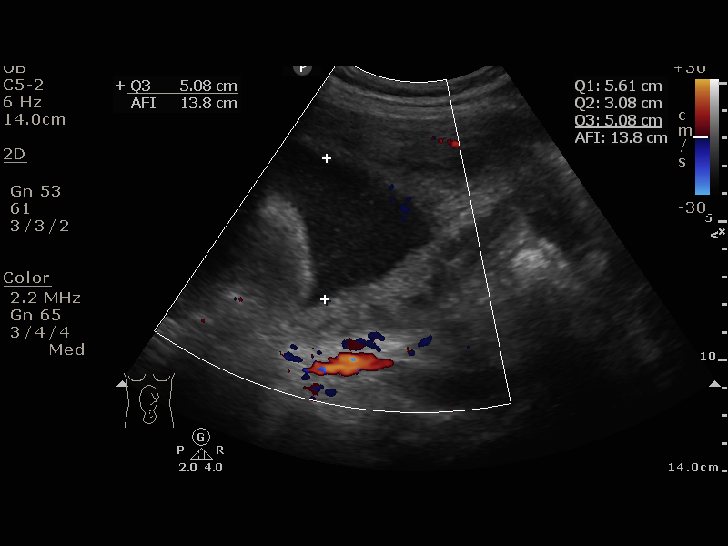
[im 15/15]
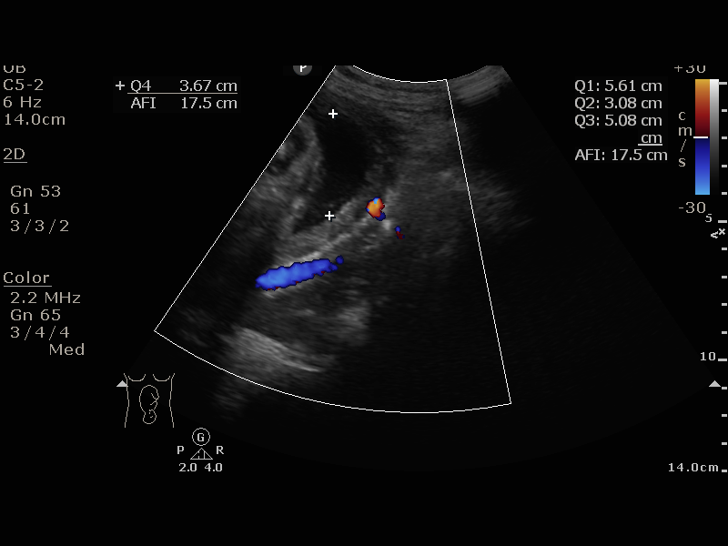

[13 of 15 positions shown; findings below may reference images not displayed]

[address]
                                                            Women's
                                                            [REDACTED]

  1  US FETAL BPP W/NONSTRESS             76818.4      NAZARETH JUMPER
 ----------------------------------------------------------------------

 ----------------------------------------------------------------------
Service(s) Provided

 ----------------------------------------------------------------------
Indications

  33 weeks gestation of pregnancy
  Advanced maternal age multigravida 35+,
  third trimester
  Gestational diabetes in pregnancy,
  controlled by oral hypoglycemic drugs
 ----------------------------------------------------------------------
Vital Signs

                                                Height:        4'11"
Fetal Evaluation

 Num Of Fetuses:         1
 Preg. Location:         Intrauterine
 Cardiac Activity:       Observed
 Presentation:           Cephalic

 Amniotic Fluid
 AFI FV:      Within normal limits

 AFI Sum(cm)     %Tile       Largest Pocket(cm)
 17.44           64

 RUQ(cm)       RLQ(cm)       LUQ(cm)        LLQ(cm)

Biophysical Evaluation

 Amniotic F.V:   Pocket => 2 cm             F. Tone:        Observed
 F. Movement:    Observed                   N.S.T:          Reactive
 F. Breathing:   Observed                   Score:          [DATE]
OB History

 Gravidity:    6         Term:   4        Prem:   0        SAB:   1
 TOP:          0       Ectopic:  0        Living: 4
Gestational Age

 LMP:           33w 4d        Date:  10/27/18                 EDD:   08/03/19
 Best:          33w 4d     Det. By:  LMP  (10/27/18)          EDD:   08/03/19
Impression

 Reassuring antenatal testing
Recommendations

 Continue with current plan of care.
                Louise, Pooja

## 2019-09-23 ENCOUNTER — Encounter: Payer: Self-pay | Admitting: General Practice

## 2019-10-07 ENCOUNTER — Other Ambulatory Visit: Payer: Self-pay

## 2019-10-07 ENCOUNTER — Encounter: Payer: Self-pay | Admitting: Student

## 2019-10-07 ENCOUNTER — Ambulatory Visit (INDEPENDENT_AMBULATORY_CARE_PROVIDER_SITE_OTHER): Payer: Medicaid Other | Admitting: Student

## 2019-10-07 DIAGNOSIS — Z30431 Encounter for routine checking of intrauterine contraceptive device: Secondary | ICD-10-CM | POA: Diagnosis not present

## 2019-10-07 NOTE — Progress Notes (Signed)
History:  [name] is a 38 y.o. [id number] who presents to clinic today for follow up from IUD placement. Patient feels that the strings are sometimes poking her; she denies any bleeding or pain with the IUD.   The following portions of the patient's history were reviewed and updated as appropriate: allergies, current medications, family history, past medical history, social history, past surgical history and problem list.  Review of Systems:  Review of Systems  All other systems reviewed and are negative.     Objective:  Physical Exam BP 118/80   Pulse 67   Ht 4\' 11"  (1.499 m)   Wt 118 lb 1.6 oz (53.6 kg)   BMI 23.85 kg/m  Physical Exam  Constitutional: She appears well-developed.  Eyes: Pupils are equal, round, and reactive to light.  Respiratory: Effort normal.  GI: Soft.  Genitourinary:    Genitourinary Comments: NEFG; patient IUD strings visible. No blood or discharge in the vagina, no CMT, suprapubic tenderness.    Musculoskeletal:        General: Normal range of motion.     Cervical back: Normal range of motion.  Neurological: She is alert.  Skin: Skin is warm.      Labs and Imaging No results found for this or any previous visit (from the past 24 hour(s)).  No results found.   Assessment & Plan:   1. IUD check up    IUD strings trimmed slightly shorter, although cautioned patient that if they are too short they can bother her more or will make removal difficult. Patient understands; will return to clinic if they continue to bother her.   [place], [place] 10/07/2019 12:24 PM

## 2019-10-17 ENCOUNTER — Telehealth: Payer: Self-pay

## 2019-10-17 NOTE — Telephone Encounter (Signed)
Called patient to do their pre-visit COVID screening.  Call went to voicemail. Unable to do prescreening.  

## 2019-10-20 ENCOUNTER — Ambulatory Visit: Payer: Medicaid Other

## 2019-11-06 ENCOUNTER — Ambulatory Visit (INDEPENDENT_AMBULATORY_CARE_PROVIDER_SITE_OTHER): Payer: Medicaid Other | Admitting: Internal Medicine

## 2019-11-06 ENCOUNTER — Encounter: Payer: Self-pay | Admitting: Internal Medicine

## 2019-11-06 ENCOUNTER — Other Ambulatory Visit: Payer: Self-pay

## 2019-11-06 VITALS — BP 125/83 | HR 72 | Temp 97.3°F | Resp 17 | Ht 59.0 in | Wt 121.8 lb

## 2019-11-06 DIAGNOSIS — Z7689 Persons encountering health services in other specified circumstances: Secondary | ICD-10-CM

## 2019-11-06 DIAGNOSIS — Z8632 Personal history of gestational diabetes: Secondary | ICD-10-CM | POA: Diagnosis not present

## 2019-11-06 DIAGNOSIS — E119 Type 2 diabetes mellitus without complications: Secondary | ICD-10-CM | POA: Insufficient documentation

## 2019-11-06 DIAGNOSIS — R7303 Prediabetes: Secondary | ICD-10-CM | POA: Diagnosis not present

## 2019-11-06 DIAGNOSIS — D229 Melanocytic nevi, unspecified: Secondary | ICD-10-CM | POA: Diagnosis not present

## 2019-11-06 LAB — POCT GLYCOSYLATED HEMOGLOBIN (HGB A1C): Hemoglobin A1C: 5.7 % — AB (ref 4.0–5.6)

## 2019-11-06 NOTE — Progress Notes (Signed)
Subjective:    Carol Harrington - 39 y.o. female MRN [id number]  Date of birth: [birth year]  HPI  Sahvannah Lazare is to establish care. Patient has a PMH significant for GDM. Had slightly abnormal PP 2hr GTT. Has not had issues with elevated blood sugars outside of pregnancy. Had issues with her third pregnancy with GDM. Denies neuropathy, polyuria, polydipsia, vision changes.   Skin lesion on left shoulder. Patient reports has been present >1 year. Has grown larger. Has always been circular. Itches and catches on clothing. Requests referral to dermatology.     Social History   reports that she has never smoked. She has never used smokeless tobacco. She reports that she does not drink alcohol or use drugs.   Family History  family history is not on file.    Health Maintenance Due  Topic Date Due  . FOOT EXAM  10/09/1988  . OPHTHALMOLOGY EXAM  10/09/1988  . URINE MICROALBUMIN  10/09/1988    Past Medical History: Patient Active Problem List   Diagnosis Date Noted  . IUD (intrauterine device) in place 08/11/2019  . Gestational diabetes 07/27/2019  . GBS (group B Streptococcus carrier), +RV culture, currently pregnant 07/13/2019  . COVID-19 virus detected 03/18/2019  . Supervision of high risk pregnancy, antepartum 02/12/2019  . GDM, class A2 02/12/2019  . Advanced maternal age in multigravida 02/12/2019    Medications: reviewed and updated   Objective:   Physical Exam BP 125/83   Pulse 72   Temp (!) 97.3 F (36.3 C) (Temporal)   Resp 17   Ht 4\' 11"  (1.499 m)   Wt 121 lb 12.8 oz (55.2 kg)   SpO2 97%   BMI 24.60 kg/m  Physical Exam  Constitutional: She is oriented to person, place, and time and well-developed, well-nourished, and in no distress. No distress.  HENT:  Head: Normocephalic and atraumatic.  Eyes: Conjunctivae and EOM are normal.  Cardiovascular: Normal rate, regular rhythm and normal heart sounds.  No murmur heard. Pulmonary/Chest: Effort normal and breath sounds  normal. No respiratory distress.  Musculoskeletal:        General: Normal range of motion.     Comments: Diabetic Foot Check -  Appearance - no lesions, ulcers or calluses Skin - no unusual pallor or redness Monofilament testing - normal bilaterally  Right - Great toe, medial, central, lateral ball and posterior foot intact Left - Great toe, medial, central, lateral ball and posterior foot intact  Neurological: She is alert and oriented to person, place, and time.  Skin: Skin is warm and dry. She is not diaphoretic.  Approximately 3/4 cm raised circumferential brown skin lesion on the superior aspect of left shoulder.    Psychiatric: Affect and judgment normal.        Assessment & Plan:   1. Encounter to establish care   2. History of gestational diabetes Failed 2hr PP GTT with minimally elevated result.  - HgB A1c - Microalbumin/Creatinine Ratio, Urine - Basic Metabolic Panel  3. Change in skin mole Appears benign. Given change in size and bothersome to patient refer to derm for evaluation/removal.  - Ambulatory referral to Dermatology  4. Pre-diabetes A1c 5.7 consistent with pre-diabetes range. Discussed long term consequences of DM and increased risk of DM with h/o GDM. Counseled on carb modified diet and importance of exercise. Repeat A1c in 6 months.  - Ambulatory referral to Ophthalmology - HgB A1c - Microalbumin/Creatinine Ratio, Urine - Basic Metabolic Panel    Phill Myron, D.O. 11/06/2019, 3:18  PM Primary Care at [facility]

## 2019-11-06 NOTE — Patient Instructions (Addendum)
Thank you for choosing Primary Care at [facility] to be your medical home!    Carol Harrington was seen by Melina Schools, DO today.   Harjot Willaims's primary care provider is Phill Myron, DO.   For the best care possible, you should try to see Phill Myron, DO whenever you come to the clinic.   We look forward to seeing you again soon!  If you have any questions about your visit today, please call us at [phone] or feel free to reach your primary care provider via [place].      Preventing Type 2 Diabetes Mellitus Type 2 diabetes (type 2 diabetes mellitus) is a long-term (chronic) disease that affects blood sugar (glucose) levels. Normally, a hormone called insulin allows glucose to enter cells in the body. The cells use glucose for energy. In type 2 diabetes, one or both of these problems may be present:  The body does not make enough insulin.  The body does not respond properly to insulin that it makes (insulin resistance). Insulin resistance or lack of insulin causes excess glucose to build up in the blood instead of going into cells. As a result, high blood glucose (hyperglycemia) develops, which can cause many complications. Being overweight or obese and having an inactive (sedentary) lifestyle can increase your risk for diabetes. Type 2 diabetes can be delayed or prevented by making certain nutrition and lifestyle changes. What nutrition changes can be made?   Eat healthy meals and snacks regularly. Keep a healthy snack with you for when you get hungry between meals, such as fruit or a handful of nuts.  Eat lean meats and proteins that are low in saturated fats, such as chicken, fish, egg whites, and beans. Avoid processed meats.  Eat plenty of fruits and vegetables and plenty of grains that have not been processed (whole grains). It is recommended that you eat: ? 1?2 cups of fruit every day. ? 2?3 cups of vegetables every day. ? 6?8 oz of whole grains every day, such  as oats, whole wheat, bulgur, brown rice, quinoa, and millet.  Eat low-fat dairy products, such as milk, yogurt, and cheese.  Eat foods that contain healthy fats, such as nuts, avocado, olive oil, and canola oil.  Drink water throughout the day. Avoid drinks that contain added sugar, such as soda or sweet tea.  Follow instructions from your health care provider about specific eating or drinking restrictions.  Control how much food you eat at a time (portion size). ? Check food labels to find out the serving sizes of foods. ? Use a kitchen scale to weigh amounts of foods.  Saute or steam food instead of frying it. Cook with water or broth instead of oils or butter.  Limit your intake of: ? Salt (sodium). Have no more than 1 tsp (2,400 mg) of sodium a day. If you have heart disease or high blood pressure, have less than ? tsp (1,500 mg) of sodium a day. ? Saturated fat. This is fat that is solid at room temperature, such as butter or fat on meat. What lifestyle changes can be made? Activity   Do moderate-intensity physical activity for at least 30 minutes on at least 5 days of the week, or as much as told by your health care provider.  Ask your health care provider what activities are safe for you. A mix of physical activities may be best, such as walking, swimming, cycling, and strength training.  Try to add physical activity into your  day. For example: ? Park in spots that are farther away than usual, so that you walk more. For example, park in a far corner of the parking lot when you go to the office or the grocery store. ? Take a walk during your lunch break. ? Use stairs instead of elevators or escalators. Weight Loss  Lose weight as directed. Your health care provider can determine how much weight loss is best for you and can help you lose weight safely.  If you are overweight or obese, you may be instructed to lose at least 5?7 % of your body weight. Alcohol and  Tobacco   Limit alcohol intake to no more than 1 drink a day for nonpregnant women and 2 drinks a day for men. One drink equals 12 oz of beer, 5 oz of wine, or 1 oz of hard liquor.  Do not use any tobacco products, such as cigarettes, chewing tobacco, and e-cigarettes. If you need help quitting, ask your health care provider. Work With [place] Provider  Have your blood glucose tested regularly, as told by your health care provider.  Discuss your risk factors and how you can reduce your risk for diabetes.  Get screening tests as told by your health care provider. You may have screening tests regularly, especially if you have certain risk factors for type 2 diabetes.  Make an appointment with a diet and nutrition specialist (registered dietitian). A registered dietitian can help you make a healthy eating plan and can help you understand portion sizes and food labels. Why are these changes important?  It is possible to prevent or delay type 2 diabetes and related health problems by making lifestyle and nutrition changes.  It can be difficult to recognize signs of type 2 diabetes. The best way to avoid possible damage to your body is to take actions to prevent the disease before you develop symptoms. What can happen if changes are not made?  Your blood glucose levels may keep increasing. Having high blood glucose for a long time is dangerous. Too much glucose in your blood can damage your blood vessels, heart, kidneys, nerves, and eyes.  You may develop prediabetes or type 2 diabetes. Type 2 diabetes can lead to many chronic health problems and complications, such as: ? Heart disease. ? Stroke. ? Blindness. ? Kidney disease. ? Depression. ? Poor circulation in the feet and legs, which could lead to surgical removal (amputation) in severe cases. Where to find support  Ask your health care provider to recommend a registered dietitian, diabetes educator, or weight loss  program.  Look for local or online weight loss groups.  Join a gym, fitness club, or outdoor activity group, such as a walking club. Where to find more information To learn more about diabetes and diabetes prevention, visit:  American Diabetes Association (ADA): www.diabetes.[employer] of Diabetes and Digestive and Kidney Diseases: FindSpin.nl To learn more about healthy eating, visit:  The U.S. Department of Agriculture Scientist, research (physical sciences)), Choose My Plate: http://wiley-williams.com/  Office of Disease Prevention and Health Promotion (ODPHP), Dietary Guidelines: SurferLive.at Summary  You can reduce your risk for type 2 diabetes by increasing your physical activity, eating healthy foods, and losing weight as directed.  Talk with your health care provider about your risk for type 2 diabetes. Ask about any blood tests or screening tests that you need to have. This information is not intended to replace advice given to you by your health care provider. Make sure you discuss  any questions you have with your health care provider. Document Revised: 01/17/2019 Document Reviewed: 11/16/2015 Elsevier Patient Education  [place].    Diabetes Mellitus and Nutrition, Adult When you have diabetes (diabetes mellitus), it is very important to have healthy eating habits because your blood sugar (glucose) levels are greatly affected by what you eat and drink. Eating healthy foods in the appropriate amounts, at about the same times every day, can help you:  Control your blood glucose.  Lower your risk of heart disease.  Improve your blood pressure.  Reach or maintain a healthy weight. Every person with diabetes is different, and each person has different needs for a meal plan. Your health care provider may recommend that you work with a diet and nutrition specialist (dietitian) to make a meal plan that is best for you. Your  meal plan may vary depending on factors such as:  The calories you need.  The medicines you take.  Your weight.  Your blood glucose, blood pressure, and cholesterol levels.  Your activity level.  Other health conditions you have, such as heart or kidney disease. How do carbohydrates affect me? Carbohydrates, also called carbs, affect your blood glucose level more than any other type of food. Eating carbs naturally raises the amount of glucose in your blood. Carb counting is a method for keeping track of how many carbs you eat. Counting carbs is important to keep your blood glucose at a healthy level, especially if you use insulin or take certain oral diabetes medicines. It is important to know how many carbs you can safely have in each meal. This is different for every person. Your dietitian can help you calculate how many carbs you should have at each meal and for each snack. Foods that contain carbs include:  Bread, cereal, rice, pasta, and crackers.  Potatoes and corn.  Peas, beans, and lentils.  Milk and yogurt.  Fruit and juice.  Desserts, such as cakes, cookies, ice cream, and candy. How does alcohol affect me? Alcohol can cause a sudden decrease in blood glucose (hypoglycemia), especially if you use insulin or take certain oral diabetes medicines. Hypoglycemia can be a life-threatening condition. Symptoms of hypoglycemia (sleepiness, dizziness, and confusion) are similar to symptoms of having too much alcohol. If your health care provider says that alcohol is safe for you, follow these guidelines:  Limit alcohol intake to no more than 1 drink per day for nonpregnant women and 2 drinks per day for men. One drink equals 12 oz of beer, 5 oz of wine, or 1 oz of hard liquor.  Do not drink on an empty stomach.  Keep yourself hydrated with water, diet soda, or unsweetened iced tea.  Keep in mind that regular soda, juice, and other mixers may contain a lot of sugar and must be  counted as carbs. What are tips for following this plan?  Reading food labels  Start by checking the serving size on the "Nutrition Facts" label of packaged foods and drinks. The amount of calories, carbs, fats, and other nutrients listed on the label is based on one serving of the item. Many items contain more than one serving per package.  Check the total grams (g) of carbs in one serving. You can calculate the number of servings of carbs in one serving by dividing the total carbs by 15. For example, if a food has 30 g of total carbs, it would be equal to 2 servings of carbs.  Check the number of  grams (g) of saturated and trans fats in one serving. Choose foods that have low or no amount of these fats.  Check the number of milligrams (mg) of salt (sodium) in one serving. Most people should limit total sodium intake to less than 2,300 mg per day.  Always check the nutrition information of foods labeled as "low-fat" or "nonfat". These foods may be higher in added sugar or refined carbs and should be avoided.  Talk to your dietitian to identify your daily goals for nutrients listed on the label. Shopping  Avoid buying canned, premade, or processed foods. These foods tend to be high in fat, sodium, and added sugar.  Shop around the outside edge of the grocery store. This includes fresh fruits and vegetables, bulk grains, fresh meats, and fresh dairy. Cooking  Use low-heat cooking methods, such as baking, instead of high-heat cooking methods like deep frying.  Cook using healthy oils, such as olive, canola, or sunflower oil.  Avoid cooking with butter, cream, or high-fat meats. Meal planning  Eat meals and snacks regularly, preferably at the same times every day. Avoid going long periods of time without eating.  Eat foods high in fiber, such as fresh fruits, vegetables, beans, and whole grains. Talk to your dietitian about how many servings of carbs you can eat at each meal.  Eat 4-6  ounces (oz) of lean protein each day, such as lean meat, chicken, fish, eggs, or tofu. One oz of lean protein is equal to: ? 1 oz of meat, chicken, or fish. ? 1 egg. ?  cup of tofu.  Eat some foods each day that contain healthy fats, such as avocado, nuts, seeds, and fish. Lifestyle  Check your blood glucose regularly.  Exercise regularly as told by your health care provider. This may include: ? 150 minutes of moderate-intensity or vigorous-intensity exercise each week. This could be brisk walking, biking, or water aerobics. ? Stretching and doing strength exercises, such as yoga or weightlifting, at least 2 times a week.  Take medicines as told by your health care provider.  Do not use any products that contain nicotine or tobacco, such as cigarettes and e-cigarettes. If you need help quitting, ask your health care provider.  Work with a Social worker or diabetes educator to identify strategies to manage stress and any emotional and social challenges. Questions to ask a health care provider  Do I need to meet with a diabetes educator?  Do I need to meet with a dietitian?  What number can I call if I have questions?  When are the best times to check my blood glucose? Where to find more information:  American Diabetes Association: diabetes.org  Academy of Nutrition and Dietetics: www.eatright.[employer] of Diabetes and Digestive and Kidney Diseases (NIH): [employer].dk Summary  A healthy meal plan will help you control your blood glucose and maintain a healthy lifestyle.  Working with a diet and nutrition specialist (dietitian) can help you make a meal plan that is best for you.  Keep in mind that carbohydrates (carbs) and alcohol have immediate effects on your blood glucose levels. It is important to count carbs and to use alcohol carefully. This information is not intended to replace advice given to you by your health care provider. Make sure you discuss any  questions you have with your health care provider. Document Revised: 09/07/2017 Document Reviewed: 10/30/2016 Elsevier Patient Education  2020 [employer].

## 2019-11-07 ENCOUNTER — Encounter: Payer: Self-pay | Admitting: Internal Medicine

## 2019-11-07 ENCOUNTER — Other Ambulatory Visit: Payer: Self-pay | Admitting: Internal Medicine

## 2019-11-07 DIAGNOSIS — R809 Proteinuria, unspecified: Secondary | ICD-10-CM | POA: Insufficient documentation

## 2019-11-07 LAB — BASIC METABOLIC PANEL
BUN/Creatinine Ratio: 27 — ABNORMAL HIGH (ref 9–23)
BUN: 14 mg/dL (ref 6–24)
CO2: 23 mmol/L (ref 20–29)
Calcium: 9.1 mg/dL (ref 8.7–10.2)
Chloride: 104 mmol/L (ref 96–106)
Creatinine, Ser: 0.52 mg/dL — ABNORMAL LOW (ref 0.57–1.00)
GFR calc Af Amer: 137 mL/min/{1.73_m2} (ref >59)
GFR calc non Af Amer: 119 mL/min/{1.73_m2} (ref >59)
Glucose: 86 mg/dL (ref 65–99)
Potassium: 3.7 mmol/L (ref 3.5–5.2)
Sodium: 139 mmol/L (ref 134–144)

## 2019-11-07 LAB — MICROALBUMIN / CREATININE URINE RATIO
Creatinine, Urine: 12.8 mg/dL
Microalb/Creat Ratio: 66 mg/g creat — ABNORMAL HIGH (ref 0–29)
Microalbumin, Urine: 8.4 ug/mL

## 2019-11-07 MED ORDER — LOSARTAN POTASSIUM 25 MG PO TABS: 25.0000 mg | ORAL_TABLET | Freq: Every day | ORAL | 3 refills | Status: DC

## 2019-11-11 ENCOUNTER — Ambulatory Visit (INDEPENDENT_AMBULATORY_CARE_PROVIDER_SITE_OTHER): Payer: Medicaid Other | Admitting: Student

## 2019-11-11 ENCOUNTER — Encounter: Payer: Self-pay | Admitting: Student

## 2019-11-11 ENCOUNTER — Other Ambulatory Visit: Payer: Self-pay

## 2019-11-11 VITALS — BP 115/76 | HR 83 | Ht 59.0 in | Wt 120.1 lb

## 2019-11-11 DIAGNOSIS — Z30017 Encounter for initial prescription of implantable subdermal contraceptive: Secondary | ICD-10-CM

## 2019-11-11 DIAGNOSIS — Z30432 Encounter for removal of intrauterine contraceptive device: Secondary | ICD-10-CM

## 2019-11-11 HISTORY — DX: Encounter for initial prescription of implantable subdermal contraceptive: Z30.017

## 2019-11-11 LAB — POCT PREGNANCY, URINE: Preg Test, Ur: NEGATIVE

## 2019-11-11 MED ORDER — ETONOGESTREL 68 MG ~~LOC~~ IMPL
68.0000 mg | DRUG_IMPLANT | Freq: Once | SUBCUTANEOUS | Status: AC
  Administered 2019-11-11: 68 mg via SUBCUTANEOUS

## 2019-11-11 NOTE — Progress Notes (Signed)
Patient ID: Carol Harrington, female   DOB: 1981-08-23, 39 y.o.   MRN: [id number]   Patient Carol Harrington is a 39 y.o.  [id number] Here for IUD removal and Nexplanon. She had IUD placed PP but feels that it is bothering her and her partner. She denies pain or bleeding today; the only pain is during intercourse.   IUD Removal  Patient was in the dorsal lithotomy position, normal external genitalia was noted.  A speculum was placed in the patient's vagina, normal discharge was noted, no lesions. The multiparous cervix was visualized, no lesions, no abnormal discharge.  The strings of the IUD were grasped and pulled using ring forceps. The IUD was removed in its entirety.  Patient tolerated the procedure well.     NEXPLANON INSERTION: Appropriate time out taken. Nexlanon site (left arm) identified and the area was prepped in usual sterile fashon. 2 cc of 1% lidocaine was used to anesthetize the area starting with the distal end.   Next, the area was cleansed with betadine and the Nexplanon was inserted without difficulty.  Pressure bandage was applied.  Pt was instructed to remove pressure bandage in a few hours, and keep insertion site covered with a bandaid for 3 days.   Nexplanon lot number: [id number], Exp is December 30, 2021.

## 2019-11-11 NOTE — Progress Notes (Signed)
Pt wants to discuss the Nexplanon

## 2019-11-13 NOTE — Progress Notes (Signed)
Patient notified of results & recommendations. Expressed understanding.

## 2019-11-17 ENCOUNTER — Telehealth: Payer: Self-pay | Admitting: Internal Medicine

## 2019-11-17 NOTE — Telephone Encounter (Signed)
Patient reinformed that she is on Losartan for kidney protection.

## 2019-11-17 NOTE — Telephone Encounter (Signed)
PT WANTS TO KNOW WHY SHE IS ON LOSARTAN PLEASE FU

## 2019-12-10 ENCOUNTER — Encounter: Payer: Self-pay | Admitting: General Practice

## 2019-12-10 DIAGNOSIS — H0102A Squamous blepharitis right eye, upper and lower eyelids: Secondary | ICD-10-CM | POA: Diagnosis not present

## 2019-12-10 DIAGNOSIS — E119 Type 2 diabetes mellitus without complications: Secondary | ICD-10-CM | POA: Diagnosis not present

## 2019-12-10 DIAGNOSIS — H0288A Meibomian gland dysfunction right eye, upper and lower eyelids: Secondary | ICD-10-CM | POA: Diagnosis not present

## 2019-12-10 DIAGNOSIS — H11812 Pseudopterygium of conjunctiva, left eye: Secondary | ICD-10-CM | POA: Diagnosis not present

## 2019-12-10 DIAGNOSIS — H0102B Squamous blepharitis left eye, upper and lower eyelids: Secondary | ICD-10-CM | POA: Diagnosis not present

## 2019-12-10 DIAGNOSIS — H0288B Meibomian gland dysfunction left eye, upper and lower eyelids: Secondary | ICD-10-CM | POA: Diagnosis not present

## 2019-12-10 LAB — HM DIABETES EYE EXAM

## 2019-12-15 NOTE — Progress Notes (Signed)
Updating HM with recent DM eye exam.

## 2019-12-18 ENCOUNTER — Encounter: Payer: Self-pay | Admitting: General Practice

## 2020-01-13 DIAGNOSIS — D485 Neoplasm of uncertain behavior of skin: Secondary | ICD-10-CM | POA: Diagnosis not present

## 2020-01-13 DIAGNOSIS — L910 Hypertrophic scar: Secondary | ICD-10-CM | POA: Diagnosis not present

## 2020-01-26 DIAGNOSIS — Z23 Encounter for immunization: Secondary | ICD-10-CM | POA: Diagnosis not present

## 2020-02-02 DIAGNOSIS — Z1281 Encounter for screening for malignant neoplasm of oral cavity: Secondary | ICD-10-CM | POA: Diagnosis not present

## 2020-02-09 DIAGNOSIS — L910 Hypertrophic scar: Secondary | ICD-10-CM | POA: Diagnosis not present

## 2020-02-23 DIAGNOSIS — Z23 Encounter for immunization: Secondary | ICD-10-CM | POA: Diagnosis not present

## 2020-03-22 DIAGNOSIS — L910 Hypertrophic scar: Secondary | ICD-10-CM | POA: Diagnosis not present

## 2020-05-04 ENCOUNTER — Ambulatory Visit: Payer: Medicaid Other | Admitting: Internal Medicine

## 2020-05-05 ENCOUNTER — Ambulatory Visit: Payer: Medicaid Other | Admitting: Internal Medicine

## 2020-05-10 ENCOUNTER — Telehealth (INDEPENDENT_AMBULATORY_CARE_PROVIDER_SITE_OTHER): Payer: Medicaid Other | Admitting: Internal Medicine

## 2020-05-10 ENCOUNTER — Encounter: Payer: Self-pay | Admitting: Internal Medicine

## 2020-05-10 DIAGNOSIS — R7303 Prediabetes: Secondary | ICD-10-CM

## 2020-05-10 DIAGNOSIS — Z1159 Encounter for screening for other viral diseases: Secondary | ICD-10-CM | POA: Diagnosis not present

## 2020-05-10 NOTE — Progress Notes (Signed)
Virtual Visit via Telephone Note  I connected with Carol Harrington, on 05/10/2020 at 3:02 PM by telephone due to the COVID-19 pandemic and verified that I am speaking with the correct person using two identifiers.   Consent: I discussed the limitations, risks, security and privacy concerns of performing an evaluation and management service by telephone and the availability of in person appointments. I also discussed with the patient that there may be a patient responsible charge related to this service. The patient expressed understanding and agreed to proceed.   Location of Patient: Home   Location of Provider: Clinic    Persons participating in Telemedicine visit: [name] [name] Dr. Juleen China   History of Present Illness: Patient has a visit to follow up on prediabetes. Last A1c 5.7 in Jan 2021 after failing 2h GTT postpartum. Has a history of GDM. She has no concerns today.    Past Medical History:  Diagnosis Date  . XIPJA-25 virus detected 03/18/2019   Positive March 15, 2019   . Diabetes mellitus without complication ([place])   . GDM, class A2 02/12/2019   Current Diabetic Medications:  None  [x]  Aspirin 81 mg daily after 12 weeks (? A2/B GDM)  For A2/B GDM or higher classes of DM [x ] Diabetes Education and Testing Supplies [ ]  Nutrition Counsult [x]  Fetal ECHO after 20 weeks  [ ]  Eye exam for retina evaluation   Baseline and surveillance labs (pulled in from [facility], refresh links as needed)  Lab Results Component Value Date  CREATININE 0.7 06/26/200  . Gestational diabetes    No Known Allergies  Current Outpatient Medications on File Prior to Visit  Medication Sig Dispense Refill  . losartan (COZAAR) 25 MG tablet Take 1 tablet (25 mg total) by mouth daily. 90 tablet 3  . Prenatal Vit-Fe Fumarate-FA (PRENATAL MULTIVITAMIN) TABS tablet Take 1 tablet by mouth daily at 12 noon.     No current facility-administered medications on file prior to visit.    Observations/Objective: NAD.  Speaking clearly.  Work of breathing normal.  Alert and oriented. Mood appropriate.   Assessment and Plan: 1. Pre-diabetes We discussed importance of carb modified diet and regular exercise routine for glucose control and to limit risk of progression to DM. Will repeat A1c.  - Hemoglobin A1c; Future  2. Need for hepatitis C screening test - HCV Ab w/Rflx to Verification; Future   Follow Up Instructions: Lab visit 8/9   I discussed the assessment and treatment plan with the patient. The patient was provided an opportunity to ask questions and all were answered. The patient agreed with the plan and demonstrated an understanding of the instructions.   The patient was advised to call back or seek an in-person evaluation if the symptoms worsen or if the condition fails to improve as anticipated.     I provided 10 minutes total of non-face-to-face time during this encounter including median intraservice time, reviewing previous notes, investigations, ordering medications, medical decision making, coordinating care and patient verbalized understanding at the end of the visit.    Phill Myron, D.O. Primary Care at [facility]  05/10/2020, 3:02 PM

## 2020-05-17 ENCOUNTER — Other Ambulatory Visit (INDEPENDENT_AMBULATORY_CARE_PROVIDER_SITE_OTHER): Payer: Medicaid Other

## 2020-05-17 ENCOUNTER — Other Ambulatory Visit: Payer: Self-pay

## 2020-05-17 DIAGNOSIS — Z1159 Encounter for screening for other viral diseases: Secondary | ICD-10-CM

## 2020-05-17 DIAGNOSIS — R7303 Prediabetes: Secondary | ICD-10-CM

## 2020-05-18 LAB — HEMOGLOBIN A1C
Est. average glucose Bld gHb Est-mCnc: 131 mg/dL
Hgb A1c MFr Bld: 6.2 % — ABNORMAL HIGH (ref 4.8–5.6)

## 2020-05-18 LAB — HCV AB W/RFLX TO VERIFICATION: HCV Ab: <0.1 s/co ratio (ref 0.0–0.9)

## 2020-05-18 LAB — HCV INTERPRETATION

## 2020-10-18 ENCOUNTER — Encounter: Payer: Self-pay | Admitting: Family Medicine

## 2020-10-18 ENCOUNTER — Ambulatory Visit: Payer: Medicaid Other | Admitting: Family Medicine

## 2020-10-18 ENCOUNTER — Other Ambulatory Visit: Payer: Self-pay

## 2020-10-18 VITALS — BP 110/78 | HR 82 | Ht 59.0 in | Wt 120.0 lb

## 2020-10-18 DIAGNOSIS — Z8632 Personal history of gestational diabetes: Secondary | ICD-10-CM

## 2020-10-18 DIAGNOSIS — R7303 Prediabetes: Secondary | ICD-10-CM

## 2020-10-18 DIAGNOSIS — Z1231 Encounter for screening mammogram for malignant neoplasm of breast: Secondary | ICD-10-CM | POA: Diagnosis not present

## 2020-10-18 DIAGNOSIS — R42 Dizziness and giddiness: Secondary | ICD-10-CM | POA: Diagnosis not present

## 2020-10-18 DIAGNOSIS — Z7689 Persons encountering health services in other specified circumstances: Secondary | ICD-10-CM | POA: Insufficient documentation

## 2020-10-18 DIAGNOSIS — N6459 Other signs and symptoms in breast: Secondary | ICD-10-CM

## 2020-10-18 DIAGNOSIS — Z23 Encounter for immunization: Secondary | ICD-10-CM | POA: Diagnosis not present

## 2020-10-18 DIAGNOSIS — I10 Essential (primary) hypertension: Secondary | ICD-10-CM

## 2020-10-18 DIAGNOSIS — Z975 Presence of (intrauterine) contraceptive device: Secondary | ICD-10-CM | POA: Insufficient documentation

## 2020-10-18 DIAGNOSIS — R5383 Other fatigue: Secondary | ICD-10-CM | POA: Diagnosis not present

## 2020-10-18 DIAGNOSIS — Z131 Encounter for screening for diabetes mellitus: Secondary | ICD-10-CM

## 2020-10-18 HISTORY — DX: Essential (primary) hypertension: I10

## 2020-10-18 HISTORY — DX: Dizziness and giddiness: R42

## 2020-10-18 HISTORY — DX: Other signs and symptoms in breast: N64.59

## 2020-10-18 NOTE — Assessment & Plan Note (Signed)
Patient reports being told she was hypertensive after delivering her last infant a year ago.  Has been on amlodipine 25 mg since that time.  BP today 110/78.  Recommend trial off amlodipine.  Follow-up in 2 weeks for BP check.

## 2020-10-18 NOTE — Assessment & Plan Note (Signed)
>>  ASSESSMENT AND PLAN FOR PRE-DIABETES WRITTEN ON 10/18/2020  4:56 PM BY Fayette Pho, MD  A1c ordered today with blood draw for CBC. If normal, will notify through MyChart; if abnormal, will call patient.

## 2020-10-18 NOTE — Assessment & Plan Note (Signed)
A1c ordered today with blood draw for CBC. If normal, will notify through [place]; if abnormal, will call patient.

## 2020-10-18 NOTE — Progress Notes (Signed)
SUBJECTIVE:   CHIEF COMPLAINT / HPI:   Establish as new patient [name] presents today as a new patient.  Was previously seen at [facility], reports this is too far for her.  Lives with her husband, 2 sons (59 and 42 years old), and 3 daughters (88, 75, and 105-year-old), along with her mother and father-in-law.  No regular exercise.  Never used tobacco, alcohol, or recreational drugs.  No prior surgeries.  Will update family history in chart.  Has received Covid vaccines x2. Has established eye care with Dr. Katy Harrington on [address], last seen 12-10-2019.  Left breast lesion Patient endorses 1 to 2-day history of left areolar lesion.  Says that it is a small hard spot that started several days ago.  Has some swelling, increased warmth, and is tender to palpation.  However, no pain at rest or with normal daily activities.  She is currently breast-feeding her 23-year-old.  She reports this happened while up breast-feeding a previous baby, however closer to her axilla, required sharp excision in office.  Says that when they excised this previous mass, "it looked small and white".  Patient denies any systemic symptoms such as fever, chills, nausea, vomiting, diarrhea, constipation.   Blood pressure Patient concerned about her blood pressure today, will like to discuss her medication.  When asked if she would need a refill on her losartan, she replied that she was not sure if she still needed the medicine.  She was fine with doing what ever we recommend.  She reports being told she was hypertensive after delivering her most recent baby in 2020, approximately 1.5 years ago.  Has been on losartan 25 mg daily ever since.  Does not have a blood pressure cuff at home and cannot tell me what her blood pressures normally run.  No headache.  Does endorse intermittent dizziness as below; of note, this dizziness was mentioned at the end of the visit.  Diabetes Patient reports history of gestational diabetes with  pregnancies in 2010 (child #3) and 2020 (child #5).  Reports that she is concerned about her blood sugars and would like to get them checked.  Per chart review, has diagnosis of prediabetes.  Intermittent dizziness Patient endorses intermittent dizziness of unknown duration.  No headache, LOC, vision changes.  Given the number of concerns already addressed in this visit, encouraged her to make separate appointment to more fully evaluate this.  PERTINENT  PMH / PSH: Prediabetes, gestational hypertension  OBJECTIVE:   BP 110/78   Pulse 82   Ht 4\' 11"  (1.499 m)   Wt 120 lb (54.4 kg)   SpO2 98%   BMI 24.24 kg/m    PHQ-9:  Depression screen [facility] 2/9 10/18/2020 06/19/2019  Decreased Interest 0 0  Down, Depressed, Hopeless 0 0  PHQ - 2 Score 0 0  Altered sleeping 0 0  Tired, decreased energy 0 0  Change in appetite 0 0  Feeling bad or failure about yourself  0 0  Trouble concentrating 0 0  Moving slowly or fidgety/restless 0 0  Suicidal thoughts 0 0  PHQ-9 Score 0 0     GAD-7:  GAD 7 : Generalized Anxiety Score 06/19/2019  Nervous, Anxious, on Edge 0  Control/stop worrying 0  Worry too much - different things 0  Trouble relaxing 1  Restless 1  Easily annoyed or irritable 0  Afraid - awful might happen 0  Total GAD 7 Score 2    Physical Exam General:  Awake, alert, oriented HEENT: PERRL, nasal mucosa slightly edematous, oral mucosa pink, moist, without lesion, intact dentition without obvious cavity, bilat TM pearly pink, flat, left external canal with moderate cerumen burden Lymph: No palpable lymphedema of head or neck Cardiovascular: Regular rate and rhythm, S1 and S2 present, no murmurs auscultated Respiratory: Lung fields clear to auscultation bilaterally Abdomen: Soft, nondistended, no TTP in any quadrant, no rebound tenderness or guarding Extremities: No bilateral lower extremity edema, palpable pedal and pretibial pulses bilaterally Neuro: Cranial nerves II through X  grossly intact, able to move all extremities spontaneously   ASSESSMENT/PLAN:   Encounter to establish care Today ordered CBC and A1c. Administered annual influenza vaccine.   Pre-diabetes A1c ordered today with blood draw for CBC. If normal, will notify through [place]; if abnormal, will call patient.   Breast complaint Lesion of left breast located on inferior areolar margin at 6 o'clock position.  Likely folliculitis.  Low suspicion for skin abscess, mastitis, mammary duct ectasia, or malignancy.  Given 1 to 2-day duration, recommend warm compresses and surveillance.  Follow-up in approximately 2 weeks for BP check and evaluation of left breast lesion.  Hypertension Patient reports being told she was hypertensive after delivering her last infant a year ago.  Has been on amlodipine 25 mg since that time.  BP today 110/78.  Recommend trial off amlodipine.  Follow-up in 2 weeks for BP check.  Dizziness, nonspecific Patient reports intermittent dizziness. Given attention to other complaints during new patient visit, will check CBC today for anemia and ask her to return for follow up appointment. Patient denies LOC, new/worsening HA, nausea, or vomiting.      Ezequiel Essex, MD [place]

## 2020-10-18 NOTE — Assessment & Plan Note (Signed)
Today ordered CBC and A1c. Administered annual influenza vaccine.

## 2020-10-18 NOTE — Assessment & Plan Note (Signed)
Patient reports intermittent dizziness. Given attention to other complaints during new patient visit, will check CBC today for anemia and ask her to return for follow up appointment. Patient denies LOC, new/worsening HA, nausea, or vomiting.

## 2020-10-18 NOTE — Patient Instructions (Addendum)
It was wonderful to meet you today. Thank you for allowing me to be a part of your care. Below is a short summary of what we discussed at your visit today:  Establishing as a patient at the [facility] Today we reviewed all of your health history, including past medical conditions, medications, past surgeries, and allergies.  We also reviewed your vaccine status and necessary screenings, those are discussed below.  Blood Pressure Your blood pressure today was normal.  Let us try stopping your blood pressure medicine and see how you do.  I want you to come back in 2 weeks to follow-up on both your blood pressure and your breast concern.  Breast concern I believe we can be conservative with our treatment of your breast concern and follow-up as needed.  Try using warm compresses (gentle heat) over your left nipple along with Tylenol or ibuprofen as needed for pain.  Watch it carefully and let us know if it gets larger, more painful, or starts to drain fluid of any color.  I want you to come back in 2 weeks to follow-up on both your blood pressure and your breast concern.  Labs Today we drew blood sample to test a CBC and A1c.  A CBC will get your blood cell counts to determine if you are anemic.  The A1c is a screening test for your blood sugar.  Vaccines Today you received the annual flu vaccine.  You may experience some residual soreness at the injection site.  Gentle stretches and regular use of that arm will help speed up your recovery.  As the vaccines are giving your immune system a "practice run" against specific infections, you may feel a little under the weather for the next several days.  We recommend rest as needed and hydrating.  Screenings Hepatitis C and HIV: We recommend every adult is screened for HIV and hepatitis at least once in a lifetime.  You have already been screened, so we will not need to order these at this time.  PAP Smear: You are up-to-date on  your Pap smear tests and will not need one until 07/08/2022.  We will schedule an appointment for this much closer to the above date.  Mammograms: I will order a mammogram to screen for breast cancer.  You will go to the [facility] imaging center, and the location, to have this done.  You will call the [facility] imaging center directly at [phone] to schedule an appointment.  A map has been provided as stapled to this packet.  Colonoscopy: As you are 40 y.o. and without family history of colon cancer, you will need to start colon cancer screenings with colonoscopies at 40 years of age.   If you have any questions or concerns, please do not hesitate to contact us via phone or MyChart message.   Ezequiel Essex, MD

## 2020-10-18 NOTE — Assessment & Plan Note (Signed)
Lesion of left breast located on inferior areolar margin at 6 o'clock position.  Likely folliculitis.  Low suspicion for skin abscess, mastitis, mammary duct ectasia, or malignancy.  Given 1 to 2-day duration, recommend warm compresses and surveillance.  Follow-up in approximately 2 weeks for BP check and evaluation of left breast lesion.

## 2020-10-19 LAB — HEMOGLOBIN A1C
Est. average glucose Bld gHb Est-mCnc: 131 mg/dL
Hgb A1c MFr Bld: 6.2 % — ABNORMAL HIGH (ref 4.8–5.6)

## 2020-10-19 LAB — CBC
Hematocrit: 40.9 % (ref 34.0–46.6)
Hemoglobin: 13.3 g/dL (ref 11.1–15.9)
MCH: 26.1 pg — ABNORMAL LOW (ref 26.6–33.0)
MCHC: 32.5 g/dL (ref 31.5–35.7)
MCV: 80 fL (ref 79–97)
Platelets: 179 10*3/uL (ref 150–450)
RBC: 5.1 x10E6/uL (ref 3.77–5.28)
RDW: 13.4 % (ref 11.7–15.4)
WBC: 5.9 10*3/uL (ref 3.4–10.8)

## 2020-10-21 ENCOUNTER — Telehealth: Payer: Self-pay | Admitting: Family Medicine

## 2020-10-21 ENCOUNTER — Encounter: Payer: Self-pay | Admitting: Family Medicine

## 2020-10-21 NOTE — Telephone Encounter (Signed)
Attempted to call patient to discuss A1c of 6.2, in prediabetic range.  Consistent with known diagnosis of prediabetes.  No answer, left HIPAA safe voicemail.  Will encourage improved diet and increased exercise.  I will also send a letter with some printed handouts.  Ezequiel Essex, MD

## 2020-10-22 NOTE — Telephone Encounter (Signed)
Patient returns call to nurse line. Informed of below. Patient verbalizes understanding.   Amrutha Avera C Lu Paradise, RN  

## 2020-11-08 ENCOUNTER — Ambulatory Visit: Payer: Medicaid Other | Admitting: Family Medicine

## 2020-11-27 ENCOUNTER — Other Ambulatory Visit: Payer: Self-pay | Admitting: Internal Medicine

## 2020-11-27 DIAGNOSIS — R809 Proteinuria, unspecified: Secondary | ICD-10-CM

## 2020-12-06 ENCOUNTER — Ambulatory Visit
Admission: RE | Admit: 2020-12-06 | Discharge: 2020-12-06 | Disposition: A | Discharge status: Home / Self Care | Payer: Medicaid Other | Source: Ambulatory Visit | Attending: Family Medicine | Admitting: Family Medicine

## 2020-12-06 ENCOUNTER — Other Ambulatory Visit: Payer: Self-pay

## 2020-12-06 DIAGNOSIS — Z1231 Encounter for screening mammogram for malignant neoplasm of breast: Secondary | ICD-10-CM | POA: Diagnosis not present

## 2021-03-01 IMAGING — MG DIGITAL SCREENING BILAT W/ CAD
4 series · 4 of 4 positions shown · non-contrast
Comparison: None.

CLINICAL DATA: Screening.

EXAM:
DIGITAL SCREENING BILATERAL MAMMOGRAM WITH CAD
TECHNIQUE: Bilateral screening digital craniocaudal and mediolateral oblique
mammograms were obtained. The images were evaluated with
computer-aided detection.

[R MLO]
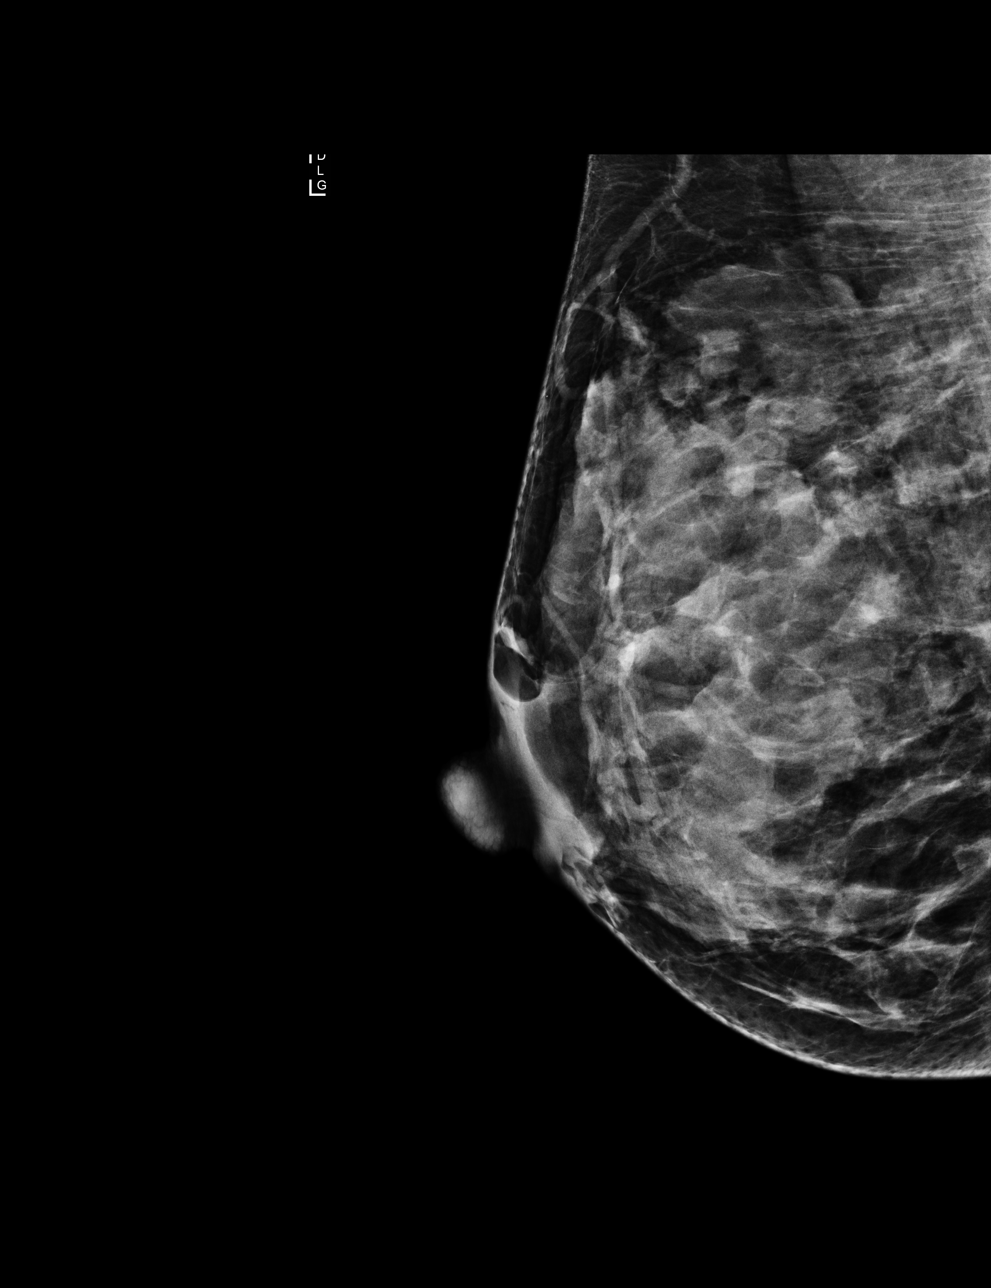

[R CC]
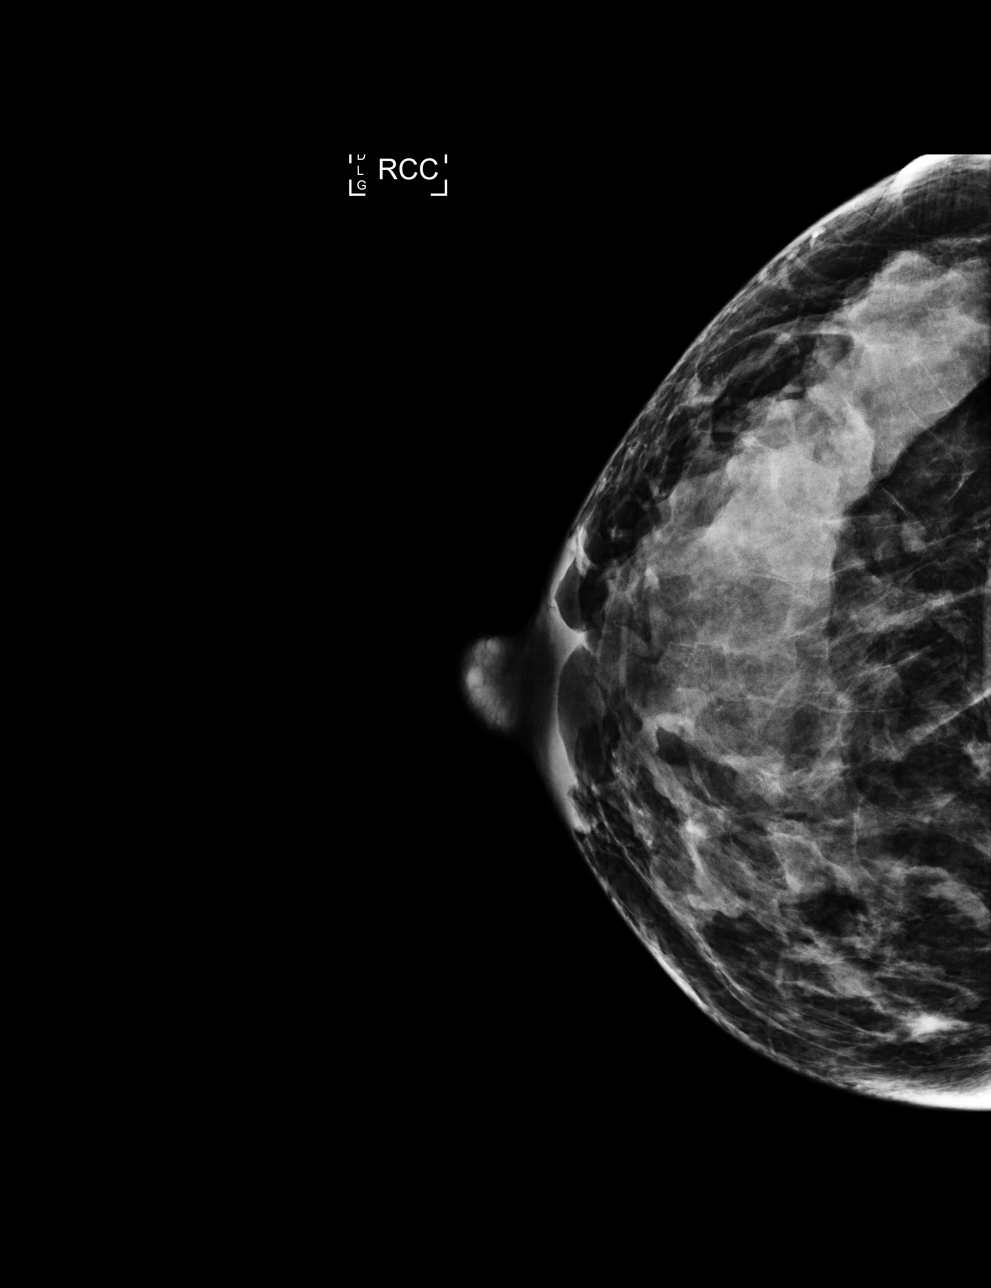

[L MLO]
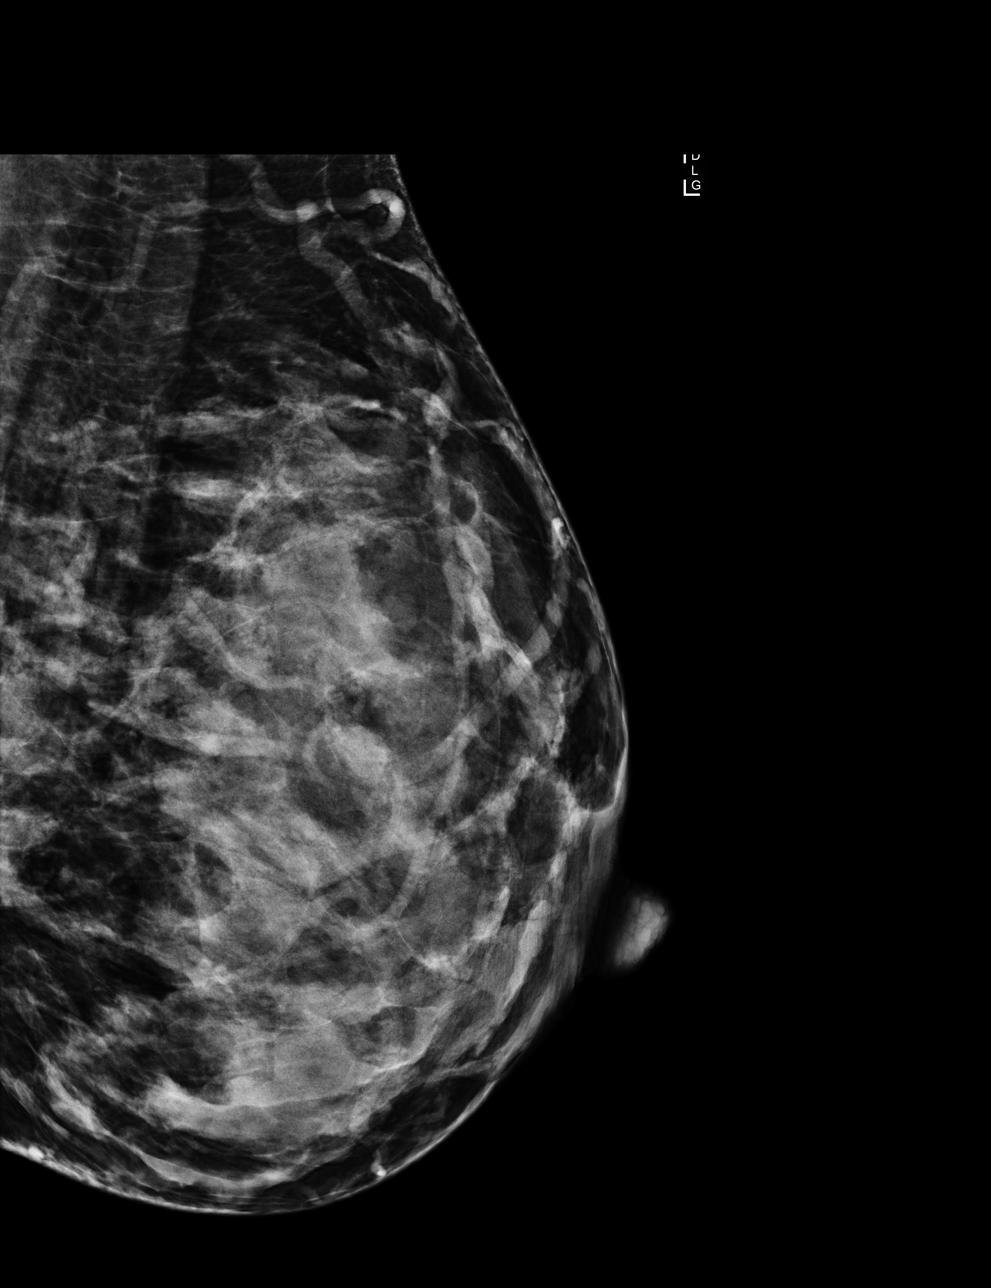

[L CC]
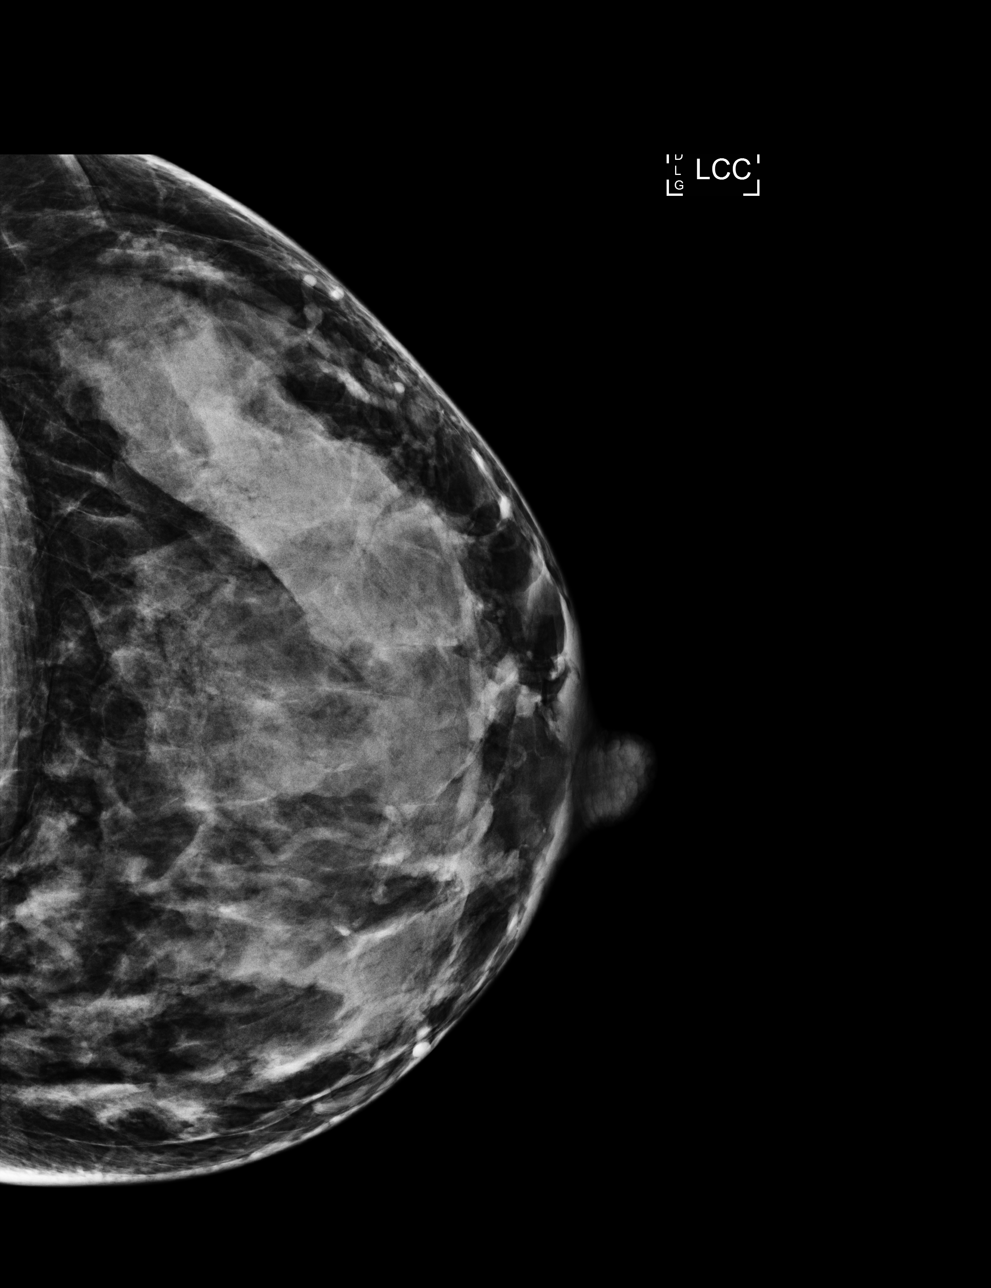

[4 of 4 positions shown; findings below may reference images not displayed]

ACR Breast Density Category d: The breast tissue is extremely dense,
which lowers the sensitivity of mammography.
FINDINGS: There are no findings suspicious for malignancy. The images were
evaluated with computer-aided detection.
IMPRESSION: No mammographic evidence of malignancy. A result letter of this
screening mammogram will be mailed directly to the patient.

RECOMMENDATION:
Screening mammogram in one year. (Code:[id number]-N-OUQ)

BI-RADS CATEGORY  1: Negative.

## 2021-06-30 ENCOUNTER — Other Ambulatory Visit: Payer: Self-pay

## 2021-06-30 ENCOUNTER — Ambulatory Visit (INDEPENDENT_AMBULATORY_CARE_PROVIDER_SITE_OTHER): Payer: Medicaid Other | Admitting: Family Medicine

## 2021-06-30 VITALS — BP 124/80 | HR 88 | Temp 98.4°F

## 2021-06-30 DIAGNOSIS — J029 Acute pharyngitis, unspecified: Secondary | ICD-10-CM

## 2021-06-30 DIAGNOSIS — H66002 Acute suppurative otitis media without spontaneous rupture of ear drum, left ear: Secondary | ICD-10-CM

## 2021-06-30 DIAGNOSIS — J0110 Acute frontal sinusitis, unspecified: Secondary | ICD-10-CM | POA: Diagnosis not present

## 2021-06-30 MED ORDER — AMOXICILLIN-POT CLAVULANATE 875-125 MG PO TABS: 1.0000 | ORAL_TABLET | Freq: Two times a day (BID) | ORAL | 0 refills | Status: DC

## 2021-06-30 NOTE — Patient Instructions (Addendum)
It was great to see you today!  Here's what we talked about:  Sinus and ear infection: Your symptoms are likely due to a sinus and ear infection. We have prescribed an antibiotic called Augmentin for 10 days. Please be sure to take all of this medication, even when you begin to feel better. We have also sent in a test for COVID-19 to ensure you do not also have this virus. Please follow up in 2 weeks on 07/14/2021 to see how you are feeling.  Take care and seek immediate care sooner if you develop increased pain, loss of hearing, or spike a fever.  Carol Harrington, [place]

## 2021-06-30 NOTE — Progress Notes (Signed)
co

## 2021-06-30 NOTE — Progress Notes (Addendum)
    SUBJECTIVE:   CHIEF COMPLAINT / HPI:   Carol Harrington (MRN: [id number]) is a 40 y.o. female with a history of prediabetes who presents for evaluation of sinus and ear pressure.  Sinus and Ear Pressure Patient states she began to experience maxillary sinus pain and pressure mainly around the bridge of the nose 2 weeks ago. Over time, the pain has increased, and now both of her ears feel as if there is pressure behind them. She takes Claritin every day for allergies and feels it has helped a little. She endorses some occasional dizziness. She states she has experienced some slight muffling of sounds. There are no known sick contacts. She has not had a fever, cough, congestion, sore throat, SOB, or chest pain.  PERTINENT  PMH / PSH: Prediabetes  OBJECTIVE:   BP 124/80   Pulse 88   Temp 98.4 F (36.9 C) (Oral)   SpO2 98%    PHYSICAL EXAM  GEN: Well-developed, in NAD HEENT: NCAT, TTP of maxillary sinuses adjacent to nasal bridge, left TM bulging with green, purulent fluid; white drainage overlying exterior of TM, MMM without erythema, lesions, or exudates CVS: RRR, normal S1/S2, no murmurs, rubs, gallops RESP: Breathing comfortably on RA, no retractions, wheezes, rhonchi, or crackles SKIN: No obvious lesions or rashes  EXT: Moves all extremities grossly equally    ASSESSMENT/PLAN:   No problem-specific Assessment & Plan notes found for this encounter.   Subacute Rhinosinusitis with Acute Otitis Media Patient is overall well and in no acute distress. Given her persistent symptoms, in conjunction with sinus pain and bulging, purulent left TM, subacute rhinosinusitis with acute otitis media is likely. Because of the wide range of presentations of COVID-19, will test and continue to consider if no improvement with antibiotics is seen. -Augmentin 875-125 mg BID for 10 days -COVID-19 NAA sent -Follow up in 2 weeks or sooner if concerns arise  [place]  I was personally present and performed or re-performed the history, physical exam and medical decision making activities of this service and have verified that the service and findings are accurately documented in the student's note.  Ezequiel Essex, MD                  07/01/2021, 7:57 PM

## 2021-07-01 LAB — SARS-COV-2, NAA 2 DAY TAT

## 2021-07-01 LAB — NOVEL CORONAVIRUS, NAA: SARS-CoV-2, NAA: NOT DETECTED

## 2021-07-15 ENCOUNTER — Other Ambulatory Visit: Payer: Self-pay

## 2021-07-15 ENCOUNTER — Ambulatory Visit: Payer: Medicaid Other | Admitting: Family Medicine

## 2021-07-15 VITALS — BP 109/74 | HR 86 | Ht 59.0 in | Wt 123.0 lb

## 2021-07-15 DIAGNOSIS — I10 Essential (primary) hypertension: Secondary | ICD-10-CM | POA: Diagnosis not present

## 2021-07-15 DIAGNOSIS — J0100 Acute maxillary sinusitis, unspecified: Secondary | ICD-10-CM | POA: Diagnosis not present

## 2021-07-15 DIAGNOSIS — Z23 Encounter for immunization: Secondary | ICD-10-CM

## 2021-07-15 DIAGNOSIS — R7303 Prediabetes: Secondary | ICD-10-CM

## 2021-07-15 LAB — POCT GLYCOSYLATED HEMOGLOBIN (HGB A1C): HbA1c, POC (controlled diabetic range): 6 % (ref 0.0–7.0)

## 2021-07-15 NOTE — Progress Notes (Signed)
    SUBJECTIVE:   CHIEF COMPLAINT / HPI:   F/u subacute sinusitis with acute otitis media - seen by PCP 06/30/21, prescribed augmentin x10 days - patient reports feeling much better - sinus pressure and muffled hearing resolved - did not develop any systemic symptoms - no concerns at this time  Pre-diabetes - A1c today 6.0, stable from 6.2 on 10/20/20   PERTINENT  PMH / PSH: Prediabetes, HTN, microalbuminuria  OBJECTIVE:   BP 109/74   Pulse 86   Ht 4\' 11"  (1.499 m)   Wt 123 lb (55.8 kg)   SpO2 98%   BMI 24.84 kg/m    PHQ-9:  Depression screen [facility] 2/9 07/15/2021 10/18/2020 06/19/2019  Decreased Interest 0 0 0  Down, Depressed, Hopeless 0 0 0  PHQ - 2 Score 0 0 0  Altered sleeping 0 0 0  Tired, decreased energy 0 0 0  Change in appetite 0 0 0  Feeling bad or failure about yourself  0 0 0  Trouble concentrating 0 0 0  Moving slowly or fidgety/restless 0 0 0  Suicidal thoughts 0 0 0  PHQ-9 Score 0 0 0     GAD-7:  GAD 7 : Generalized Anxiety Score 06/19/2019  Nervous, Anxious, on Edge 0  Control/stop worrying 0  Worry too much - different things 0  Trouble relaxing 1  Restless 1  Easily annoyed or irritable 0  Afraid - awful might happen 0  Total GAD 7 Score 2    Physical Exam General: Awake, alert, oriented HEENT: PERRL, bilateral TM pearly pink and flat, bilateral external auditory canals with minimal cerumen burden Cardiovascular: Regular rate and rhythm, S1 and S2 present, no murmurs auscultated Respiratory: Lung fields clear to auscultation bilaterally  ASSESSMENT/PLAN:   Pre-diabetes A1c today 6.0, stable from 6.10 October 2020. No changes at this time. Provided handout on diet and exercise considerations for prediabetes. Next A1c check in one year.   Hypertension BP today 109/74. Has been off amlodipine since January 2022 with normal blood pressure. As this problem has resolved, I will remove it from her active problems list.   Sinusitis With  bilateral acute otitis media. Has resolved s/p augmentin x10 days. No further treatment indicated. I am glad to see her better.      Ezequiel Essex, MD [place]

## 2021-07-15 NOTE — Patient Instructions (Signed)
It was wonderful to see you today. Thank you for allowing me to be a part of your care. Below is a short summary of what we discussed at your visit today:  Sinus and ear infection Your ears and sinuses look very much improved!  I think you are completely healed. Please let us know if your symptoms return.  Prediabetes Today your A1c is 6.0.  This is the same as previous measurements, meaning you are stable.  This blood sugar is elevated above normal, but not yet in the diabetic range.  Often, people in this range can manage the blood sugar well with diet and exercise.  I have attached some information for you about these.  Health Maintenance We like to think about ways to keep you healthy for years to come. Below are some interventions and screenings we can offer to keep you healthy: -Flu shot (you got this today!) -COVID booster (available anytime, simply call ahead and request a nurse only vaccine visit)   Please bring all of your medications to every appointment!  If you have any questions or concerns, please do not hesitate to contact us via phone or MyChart message.   Ezequiel Essex, MD

## 2021-07-17 ENCOUNTER — Encounter: Payer: Self-pay | Admitting: Family Medicine

## 2021-07-17 DIAGNOSIS — J329 Chronic sinusitis, unspecified: Secondary | ICD-10-CM | POA: Insufficient documentation

## 2021-07-17 NOTE — Assessment & Plan Note (Signed)
A1c today 6.0, stable from 6.10 October 2020. No changes at this time. Provided handout on diet and exercise considerations for prediabetes. Next A1c check in one year.

## 2021-07-17 NOTE — Assessment & Plan Note (Addendum)
BP today 109/74. Has been off amlodipine since January 2022 with normal blood pressure. As this problem has resolved, I will remove it from her active problems list.

## 2021-07-17 NOTE — Assessment & Plan Note (Signed)
>>  ASSESSMENT AND PLAN FOR PRE-DIABETES WRITTEN ON 07/17/2021 11:36 PM BY Fayette Pho, MD  A1c today 6.0, stable from 6.10 October 2020. No changes at this time. Provided handout on diet and exercise considerations for prediabetes. Next A1c check in one year.

## 2021-07-17 NOTE — Assessment & Plan Note (Signed)
With bilateral acute otitis media. Has resolved s/p augmentin x10 days. No further treatment indicated. I am glad to see her better.

## 2021-07-25 DIAGNOSIS — Z789 Other specified health status: Secondary | ICD-10-CM | POA: Diagnosis not present

## 2021-07-25 DIAGNOSIS — L811 Chloasma: Secondary | ICD-10-CM | POA: Diagnosis not present

## 2021-07-25 DIAGNOSIS — L910 Hypertrophic scar: Secondary | ICD-10-CM | POA: Diagnosis not present

## 2021-07-25 DIAGNOSIS — L298 Other pruritus: Secondary | ICD-10-CM | POA: Diagnosis not present

## 2021-08-25 DIAGNOSIS — L910 Hypertrophic scar: Secondary | ICD-10-CM | POA: Diagnosis not present

## 2022-01-26 ENCOUNTER — Encounter: Payer: Medicaid Other | Admitting: Family Medicine

## 2022-01-26 ENCOUNTER — Ambulatory Visit (INDEPENDENT_AMBULATORY_CARE_PROVIDER_SITE_OTHER): Payer: Medicaid Other | Admitting: Family Medicine

## 2022-01-26 VITALS — BP 122/78 | HR 91 | Ht 59.0 in | Wt 126.4 lb

## 2022-01-26 DIAGNOSIS — Z13220 Encounter for screening for lipoid disorders: Secondary | ICD-10-CM

## 2022-01-26 DIAGNOSIS — R202 Paresthesia of skin: Secondary | ICD-10-CM

## 2022-01-26 DIAGNOSIS — Z131 Encounter for screening for diabetes mellitus: Secondary | ICD-10-CM

## 2022-01-26 DIAGNOSIS — Z1159 Encounter for screening for other viral diseases: Secondary | ICD-10-CM

## 2022-01-26 DIAGNOSIS — Z114 Encounter for screening for human immunodeficiency virus [HIV]: Secondary | ICD-10-CM | POA: Diagnosis not present

## 2022-01-26 DIAGNOSIS — Z975 Presence of (intrauterine) contraceptive device: Secondary | ICD-10-CM | POA: Diagnosis not present

## 2022-01-26 DIAGNOSIS — R7303 Prediabetes: Secondary | ICD-10-CM | POA: Diagnosis not present

## 2022-01-26 HISTORY — DX: Paresthesia of skin: R20.2

## 2022-01-26 LAB — POCT GLYCOSYLATED HEMOGLOBIN (HGB A1C): HbA1c, POC (prediabetic range): 6.1 % (ref 5.7–6.4)

## 2022-01-26 NOTE — Assessment & Plan Note (Addendum)
Unclear cause of right paresthesia in fingertips and toes.  Differentials include diabetes, B12, folate acid deficiencies, thyroid abnormality, electrolyte abnormalities, multiple sclerosis etc. Obtained A1c, B12, folate, TSH, CMP today. Follow-up with PCP for further investigations. Consider imaging if symptoms persist. ?

## 2022-01-26 NOTE — Assessment & Plan Note (Signed)
>>  ASSESSMENT AND PLAN FOR PRE-DIABETES WRITTEN ON 01/26/2022  2:33 PM BY PATEL, POONAM, MD  Obtained A1c today.

## 2022-01-26 NOTE — Patient Instructions (Signed)
Thank you for coming to see me today. It was a pleasure. Today we discussed your numbness, I do not know the cause but I would like to do some blood work to rule out causes. I recommend labs. I will call you if there is anything abnormal. ? ?Nexplanon needs to be removed by march 2024. ? ?Please follow-up with PCP ? ?If you have any questions or concerns, please do not hesitate to call the office at [phone]. ? ?Best wishes,  ? ?Dr Posey Pronto   ?

## 2022-01-26 NOTE — Assessment & Plan Note (Signed)
Obtained A1c today. ?

## 2022-01-26 NOTE — Progress Notes (Signed)
? ? ?  SUBJECTIVE:  ? ?Chief compliant/HPI: annual examination ? ?Carol Harrington is a 41 y.o. who presents today for an annual exam.  ? ?Right sided numbness ?Reports right sided arm and leg numbness approx 1 year ago. Occurs sometimes at night, lasts approx 1 hrs. It worsened after she had a baby. Pt is right handed. Pt works in a [employer]. She frequently uses the right arm. Triggers: none. Alleviating factors: none.  Denies limb weakness, headache, vision changes, confusion, vomiting, speech changes, seizures, paraesthesia/numbness, ataxia or urinary/bowel incontinence.  ? ?History tabs reviewed and updated.  ? ?Review of systems form reviewed and notable for.  ? ?OBJECTIVE:  ? ? ?BP 122/78   Pulse 91   Ht $R'4\' 11"'IV$  (1.499 m)   Wt 126 lb 6.4 oz (57.3 kg)   SpO2 98%   BMI 25.53 kg/m?   ? ?General: Alert, no acute distress ?Cardio: Normal S1 and S2, RRR, no r/m/g ?Pulm: CTAB, normal work of breathing ?Abdomen: Bowel sounds normal. Abdomen soft and non-tender.  ?Extremities: No peripheral edema.  ?Neuro: Cranial nerves grossly intact  ?5/5 strength upper and lower extremities, normal sensation throughout  ? ?ASSESSMENT/PLAN:  ? ?Nexplanon in place ?Good for another year, needs to be removed in March 2024. ? ?Pre-diabetes ?Obtained A1c today. ? ?Paresthesia of right upper and lower extremity ?Unclear cause of right paresthesia in fingertips and toes.  Differentials include diabetes, B12, folate acid deficiencies, thyroid abnormality, electrolyte abnormalities, multiple sclerosis etc. Obtained A1c, B12, folate, TSH, CMP today. Follow-up with PCP for further investigations. Consider imaging if symptoms persist. ?  ? ?Annual Examination  ?See AVS for age appropriate recommendations.   ?PHQ score 3, reviewed and discussed.  ?Blood pressure reviewed and at goal.  ?Asked about intimate partner violence and resources given as appropriate  ?The patient currently uses nexplanon for contraception. Due for removal March 2024.   ? ?Considered the following items based upon USPSTF recommendations: ?Diabetes screening: ordered ?Screening for elevated cholesterol: ordered ?HIV testing: ordered ?Hepatitis C: discussed ?Hepatitis B: ordered ?Syphilis if at high risk: discussed ?GC/CT not at high risk and not ordered. ?Reviewed risk factors for latent tuberculosis and not indicated ?Reviewed risk factors for osteoporosis. Using FRAX tool estimated risk of major osteoporotic fracture of  2%, early screening not ordered ? ? ?Discussed family history, BRCA testing not indicated. Marland Kitchen  ?Cervical cancer screening: prior Pap reviewed, repeat due in 07/08/22 ?Breast cancer screening: declined will wait until next year  ?Colorectal cancer screening: discussed and declined today.  if age 74 or over.  ? ?Follow up in 1 year or sooner if indicated.  ? ? ?Lattie Haw, MD ?[place]   ?

## 2022-01-26 NOTE — Assessment & Plan Note (Addendum)
Good for another year, needs to be removed in March 2024. ?

## 2022-01-27 LAB — HIV ANTIBODY (ROUTINE TESTING W REFLEX): HIV Screen 4th Generation wRfx: NONREACTIVE

## 2022-01-27 LAB — COMPREHENSIVE METABOLIC PANEL
ALT: 15 IU/L (ref 0–32)
AST: 17 IU/L (ref 0–40)
Albumin/Globulin Ratio: 1.9 (ref 1.2–2.2)
Albumin: 4.5 g/dL (ref 3.8–4.8)
Alkaline Phosphatase: 63 IU/L (ref 44–121)
BUN/Creatinine Ratio: 17 (ref 9–23)
BUN: 10 mg/dL (ref 6–24)
Bilirubin Total: 0.3 mg/dL (ref 0.0–1.2)
CO2: 22 mmol/L (ref 20–29)
Calcium: 9 mg/dL (ref 8.7–10.2)
Chloride: 105 mmol/L (ref 96–106)
Creatinine, Ser: 0.59 mg/dL (ref 0.57–1.00)
Globulin, Total: 2.4 g/dL (ref 1.5–4.5)
Glucose: 162 mg/dL — ABNORMAL HIGH (ref 70–99)
Potassium: 3.7 mmol/L (ref 3.5–5.2)
Sodium: 141 mmol/L (ref 134–144)
Total Protein: 6.9 g/dL (ref 6.0–8.5)
eGFR: 115 mL/min/{1.73_m2} (ref >59)

## 2022-01-27 LAB — LIPID PANEL
Chol/HDL Ratio: 3.2 ratio (ref 0.0–4.4)
Cholesterol, Total: 170 mg/dL (ref 100–199)
HDL: 53 mg/dL (ref >39)
LDL Chol Calc (NIH): 93 mg/dL (ref 0–99)
Triglycerides: 136 mg/dL (ref 0–149)
VLDL Cholesterol Cal: 24 mg/dL (ref 5–40)

## 2022-01-27 LAB — VITAMIN B12: Vitamin B-12: 334 pg/mL (ref 232–1245)

## 2022-01-27 LAB — TSH: TSH: 1.26 u[IU]/mL (ref 0.450–4.500)

## 2022-01-27 LAB — HEPATITIS B SURFACE ANTIGEN: Hepatitis B Surface Ag: NEGATIVE

## 2022-01-27 LAB — FOLATE: Folate: 10.5 ng/mL (ref >3.0)

## 2022-03-14 ENCOUNTER — Encounter: Payer: Self-pay | Admitting: *Deleted

## 2022-06-08 ENCOUNTER — Other Ambulatory Visit: Payer: Self-pay

## 2022-06-08 ENCOUNTER — Ambulatory Visit: Payer: Medicaid Other | Admitting: Family Medicine

## 2022-06-08 ENCOUNTER — Encounter: Payer: Self-pay | Admitting: Family Medicine

## 2022-06-08 VITALS — BP 125/104 | HR 92 | Wt 123.5 lb

## 2022-06-08 DIAGNOSIS — R202 Paresthesia of skin: Secondary | ICD-10-CM

## 2022-06-08 NOTE — Assessment & Plan Note (Signed)
Pt has worsening parasthesias and pain now bothering her during the day as well. Red flags include dropping something from her R hand, decreased R patellar reflex. Concern for cervical stenosis vs spinal cord etiology given sensation, reflex changes. Wakes patient up during sleep.  - Cspine X ray and MRI ordered  - Ibuprofen and tylenol for pain relief  - Educated patient regarding strict return/ED precautions including rapidly progressive weakness, gait abnormalities.

## 2022-06-08 NOTE — Patient Instructions (Signed)
It was great to see you today! Thank you for choosing [facility] for your primary care. Carol Harrington was seen for R arm and leg numbness.  Today we addressed: Right arm and leg pain and numbness - I ordered an Xray of your neck as well as an MRI. They should call you to schedule. Please call the clinic in two weeks if they have not called to schedule. Please take ibuprofen 600 mg 3 times a day for pain and tylenol as needed as well. I will reach out to you regarding the results.   If you haven't already, sign up for My Chart to have easy access to your labs results, and communication with your primary care physician.  We are checking some labs today. If they are abnormal, I will call you. If they are normal, I will send you a MyChart message (if it is active) or a letter in the mail. If you do not hear about your labs in the next 2 weeks, please call the office.   You should return to our clinic No follow-ups on file.  I recommend that you always bring your medications to each appointment as this makes it easy to ensure you are on the correct medications and helps [place] not miss refills when you need them.  Please arrive 15 minutes before your appointment to ensure smooth check in process.  We appreciate your efforts in making this happen.  Please call the clinic at [phone] if your symptoms worsen or you have any concerns.  Thank you for allowing me to participate in your care, Lowry Ram, MD 06/08/2022, 9:24 AM PGY-1, [place]
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
## 2022-07-04 ENCOUNTER — Telehealth: Payer: Self-pay

## 2022-07-04 NOTE — Telephone Encounter (Signed)
Patient informed of upcoming appointment.  07/14/2022 MRI Cervical Spine w/o contrast [place] [phone] arrival  Patient verbalized understanding.  Ozella Almond, [place]

## 2022-07-14 ENCOUNTER — Ambulatory Visit (HOSPITAL_COMMUNITY)
Admission: RE | Admit: 2022-07-14 | Discharge: 2022-07-14 | Disposition: A | Discharge status: Home / Self Care | Payer: Medicaid Other | Source: Ambulatory Visit | Attending: Family Medicine | Admitting: Family Medicine

## 2022-07-14 DIAGNOSIS — M4802 Spinal stenosis, cervical region: Secondary | ICD-10-CM | POA: Diagnosis not present

## 2022-07-14 DIAGNOSIS — M5021 Other cervical disc displacement,  high cervical region: Secondary | ICD-10-CM | POA: Diagnosis not present

## 2022-07-14 DIAGNOSIS — R202 Paresthesia of skin: Secondary | ICD-10-CM

## 2022-07-14 DIAGNOSIS — M50223 Other cervical disc displacement at C6-C7 level: Secondary | ICD-10-CM | POA: Diagnosis not present

## 2022-07-14 DIAGNOSIS — M50221 Other cervical disc displacement at C4-C5 level: Secondary | ICD-10-CM | POA: Diagnosis not present

## 2022-07-20 NOTE — Progress Notes (Signed)
Chiari 1 malformation as well as disc bulging causing foraminal stenosis.   Please call patient to schedule appointment to check on neuro symptoms.

## 2022-07-27 ENCOUNTER — Telehealth: Payer: Self-pay | Admitting: Family Medicine

## 2022-07-27 NOTE — Telephone Encounter (Signed)
Have called multiple times before regarding MRI results and left VM. Called this time with success. Discussed MRI results and made appt for 11/9 with PCP Dr. Ezequiel Essex.

## 2022-08-16 DIAGNOSIS — G935 Compression of brain: Secondary | ICD-10-CM | POA: Insufficient documentation

## 2022-08-16 DIAGNOSIS — M4802 Spinal stenosis, cervical region: Secondary | ICD-10-CM | POA: Insufficient documentation

## 2022-08-16 DIAGNOSIS — M5030 Other cervical disc degeneration, unspecified cervical region: Secondary | ICD-10-CM | POA: Insufficient documentation

## 2022-08-16 NOTE — Patient Instructions (Incomplete)
It was wonderful to see you today. Thank you for allowing me to be a part of your care. Below is a short summary of what we discussed at your visit today:  Pain and weakness We discussed the findings of your MRI scan. You have the following diagnoses: Chiari malformation type I  This is where a part of your brain presses down through the whole at the base of your skull and compresses your spinal cord We believe this could be causing many of your symptoms Foraminal stenosis of cervical region This means there is a narrowing where the nerves come off the spinal cord in your neck We believe this could be causing many of your symptoms Disc bulging of cervical region This means the cushion in between the vertebra (bones) in your spine has been squished it is poking out a little bit.  Based on th MRI scan, there is no evidence that this disc bulging is pressing on any nerves.   We do not believe the disc bulging is causing your symptoms.  Neurology I have referred you to neurology for evaluation.  Given that we found you have a Chiari I malformation, you will need to be evaluated and followed by neurologist.  Someone from their office should be calling you in 1 to 2 weeks to schedule an appointment.  If you do not hear from them, let us know. We may need to nudge along the referral.    Neurosurgery I have referred you to neurosurgery for evaluation for possible surgery.  If the surgeon believes the problems in your spine could be fixed with surgery, they will talk to you about this.  Someone from their office should be calling you in 1 to 2 weeks to schedule an appointment.  If you do not hear from them, let us know. We may need to nudge along the referral.       If you have any questions or concerns, please do not hesitate to contact us via phone or MyChart message.   Ezequiel Essex, MD

## 2022-08-16 NOTE — Progress Notes (Signed)
SUBJECTIVE:   CHIEF COMPLAINT / HPI:   Paresthesia follow-up Last seen at [facility] by Dr. Gwendolyn Lima 06/08/2022.  At that time, she described 91-monthhistory of paresthesias of right upper and lower extremities, including sensation and reflex changes.  Difficulty with grip in the right hand.  MRI C-spine without contrast performed 10/6 found Chiari I malformation, right C4 foraminal stenosis, uncovertebral spurring at C3-4, mild noncompressive disc bulging at C4-5 through C6-7 without significant stenosis or neural impingement.  Today, patient reports she still has some right arm and leg numbness and tingling.  Little bit of pain, the right leg becomes bothersome sometimes.  She feels as though her strength is back to normal, no subjective weakness.  Denies any difficulty walking, does not drag her right foot.  No saddle anesthesia or incontinence of bowel or bladder.  PERTINENT  PMH / PSH:  Patient Active Problem List   Diagnosis Date Noted   Need for immunization against influenza 08/18/2022   Chiari malformation type I ([place] 08/16/2022   Foraminal stenosis of cervical region 08/16/2022   Bulge of cervical disc without myelopathy 08/16/2022   Sinusitis 07/17/2021   Nexplanon in place 10/18/2020   Encounter to establish care 10/18/2020   History of gestational diabetes 10/18/2020   Microalbuminuria 11/07/2019   Pre-diabetes 11/06/2019    OBJECTIVE:   BP 120/84   Pulse 77   Ht '4\' 11"'$  (1.499 m)   Wt 125 lb 3.2 oz (56.8 kg)   SpO2 97%   BMI 25.29 kg/m    PHQ-9:     08/17/2022    8:44 AM 06/08/2022   10:26 AM 01/26/2022    1:49 PM  Depression screen PHQ 2/9  Decreased Interest 1 1 0  Down, Depressed, Hopeless 0 1 0  PHQ - 2 Score 1 2 0  Altered sleeping 0 1 0  Tired, decreased energy 1 1 0  Change in appetite 0 0 0  Feeling bad or failure about yourself  0 0 0  Trouble concentrating 1 0 0  Moving slowly or fidgety/restless 0 0 0  Suicidal thoughts 0 0 0  PHQ-9 Score 3 4 0   Difficult doing work/chores Not difficult at all Not difficult at all Not difficult at all   Physical Exam General: Awake, alert, oriented Extremities: No bilateral lower extremity edema, palpable pedal and pretibial pulses bilaterally Neuro: Equal shoulder shrug, BUE and BLE strength 5/5 and equal bilaterally, grip strength 5/5 and equal bilaterally, BUE sensation intact and equal, BUE reflexes intact  ASSESSMENT/PLAN:   Need for immunization against influenza Flu vaccine administered, tolerated well without adverse side effects.  Foraminal stenosis of cervical region [facility] to foraminal stenosis noted on MRI 07/14/2022.  Discussed with patient possibility for neurosurgical referral, however given improvement of her symptoms she politely declines at this time.  Discussed referral in future if she develops worsening symptoms such as weakness or difficulty with grip.  Chi[place] malformation type I ([place] Found on brain MRI in 10/6.  I believe she is asymptomatic and this is an incidental finding.  Neuro exam of head and neck unremarkable.  We will continue to monitor.  Bulge of cervical disc without myelopathy Diagnosed on MRI C-spine.  However, I do not believe that this is the cause of her symptoms given there is no sign of neural impingement.  We will continue to monitor.    CEzequiel Essex MD [facility]

## 2022-08-17 ENCOUNTER — Ambulatory Visit: Payer: Medicaid Other | Admitting: Family Medicine

## 2022-08-17 ENCOUNTER — Encounter: Payer: Self-pay | Admitting: Family Medicine

## 2022-08-17 VITALS — BP 120/84 | HR 77 | Ht 59.0 in | Wt 125.2 lb

## 2022-08-17 DIAGNOSIS — G935 Compression of brain: Secondary | ICD-10-CM | POA: Diagnosis not present

## 2022-08-17 DIAGNOSIS — M5030 Other cervical disc degeneration, unspecified cervical region: Secondary | ICD-10-CM

## 2022-08-17 DIAGNOSIS — M4802 Spinal stenosis, cervical region: Secondary | ICD-10-CM | POA: Diagnosis not present

## 2022-08-17 DIAGNOSIS — Z23 Encounter for immunization: Secondary | ICD-10-CM | POA: Diagnosis not present

## 2022-08-18 ENCOUNTER — Encounter: Payer: Self-pay | Admitting: Family Medicine

## 2022-08-18 DIAGNOSIS — Z23 Encounter for immunization: Secondary | ICD-10-CM | POA: Insufficient documentation

## 2022-08-18 NOTE — Assessment & Plan Note (Signed)
Pain and paresthesia described previously in [facility] note most likely due to foraminal stenosis noted on MRI 07/14/2022.  Discussed with patient possibility for neurosurgical referral, however given improvement of her symptoms she politely declines at this time.  Discussed referral in future if she develops worsening symptoms such as weakness or difficulty with grip.

## 2022-08-18 NOTE — Assessment & Plan Note (Addendum)
Found on brain MRI in 10/6.  I believe she is asymptomatic and this is an incidental finding.  Neuro exam of head and neck unremarkable.  We will continue to monitor.

## 2022-08-18 NOTE — Assessment & Plan Note (Signed)
Diagnosed on MRI C-spine.  However, I do not believe that this is the cause of her symptoms given there is no sign of neural impingement.  We will continue to monitor.

## 2022-08-18 NOTE — Assessment & Plan Note (Signed)
Flu vaccine administered, tolerated well without adverse side effects.

## 2023-02-01 ENCOUNTER — Encounter: Payer: Self-pay | Admitting: Student

## 2023-02-01 ENCOUNTER — Ambulatory Visit (INDEPENDENT_AMBULATORY_CARE_PROVIDER_SITE_OTHER): Payer: Medicaid Other | Admitting: Student

## 2023-02-01 VITALS — BP 116/76 | HR 85 | Ht 59.0 in | Wt 128.6 lb

## 2023-02-01 DIAGNOSIS — R809 Proteinuria, unspecified: Secondary | ICD-10-CM

## 2023-02-01 DIAGNOSIS — R42 Dizziness and giddiness: Secondary | ICD-10-CM

## 2023-02-01 DIAGNOSIS — Z8632 Personal history of gestational diabetes: Secondary | ICD-10-CM

## 2023-02-01 DIAGNOSIS — R7303 Prediabetes: Secondary | ICD-10-CM | POA: Diagnosis not present

## 2023-02-01 NOTE — Progress Notes (Signed)
    SUBJECTIVE:   CHIEF COMPLAINT / HPI:   Monisha Siebel is a 42 y.o. female  presenting for annual physical. She has been doing well since her last visit and reports mild intermittent dizziness ongoing for 2-3 months. She denies falls, loss of balance, HA, vision changes. Not currently taking any medications.   Due for pap smear.   PERTINENT  PMH / PSH: Reviewed   OBJECTIVE:   BP 116/76   Pulse 85   Ht  (1.499 m)   Wt 128 lb 9.6 oz (58.3 kg)   SpO2 98%   BMI 25.97 kg/m   Well-appearing, no acute distress Cardio: Regular rate, regular rhythm, no murmurs on exam. Pulm: Clear, no wheezing, no crackles. No increased work of breathing Abdominal: bowel sounds present, soft, non-tender, non-distended Extremities: no peripheral edema   Neuro: CN II: PERRL CN III, IV,VI: EOMI CV V: Normal sensation in V1, V2, V3 CVII: Symmetric smile and brow raise CN VIII: Normal hearing CN IX,X: Symmetric palate raise  CN XI: 5/5 shoulder shrug CN XII: Symmetric tongue protrusion  UE and LE strength 5/5 Normal sensation in UE and LE bilaterally  No ataxia with finger to nose, normal heel to shin  Negative Rhomberg    ASSESSMENT/PLAN:    Microalbuminuria -     Microalbumin / creatinine urine ratio  Pre-diabetes -     Hemoglobin A1c -     Comprehensive metabolic panel -     Lipid panel  Dizziness Assessment & Plan: Neurological exam WNL, will check CBC today to rule out anemia. Encouraged patient to see an eye doctor for annual exam.   Orders: -     CBC     Glendale Chard, DO [facility]

## 2023-02-01 NOTE — Patient Instructions (Signed)
It was great to see you today!   Today we addressed: Labs. I am obtaining several labs, when they result I will send you a message through MyChart.  Please schedule an appointment to have your eyes checked and to complete your pap smear (screening for cervical cancer).   You should return to our clinic Return in about 1 year (around 02/01/2024).  Please arrive 15 minutes before your appointment to ensure smooth check in process.    Please call the clinic at [phone] if your symptoms worsen or you have any concerns.  Thank you for allowing me to participate in your care, Dr. Glendale Chard [facility]

## 2023-02-01 NOTE — Assessment & Plan Note (Signed)
Neurological exam WNL, will check CBC today to rule out anemia. Encouraged patient to see an eye doctor for annual exam.

## 2023-02-02 ENCOUNTER — Telehealth: Payer: Self-pay | Admitting: Student

## 2023-02-02 LAB — CBC
Hematocrit: 42.6 % (ref 34.0–46.6)
Hemoglobin: 13.7 g/dL (ref 11.1–15.9)
MCH: 26.3 pg — ABNORMAL LOW (ref 26.6–33.0)
MCHC: 32.2 g/dL (ref 31.5–35.7)
MCV: 82 fL (ref 79–97)
Platelets: 225 10*3/uL (ref 150–450)
RBC: 5.2 x10E6/uL (ref 3.77–5.28)
RDW: 13.6 % (ref 11.7–15.4)
WBC: 5.4 10*3/uL (ref 3.4–10.8)

## 2023-02-02 LAB — MICROALBUMIN / CREATININE URINE RATIO
Creatinine, Urine: 56.6 mg/dL
Microalb/Creat Ratio: 237 mg/g creat — ABNORMAL HIGH (ref 0–29)
Microalbumin, Urine: 134.3 ug/mL

## 2023-02-02 LAB — COMPREHENSIVE METABOLIC PANEL
ALT: 24 IU/L (ref 0–32)
AST: 17 IU/L (ref 0–40)
Albumin/Globulin Ratio: 1.5 (ref 1.2–2.2)
Albumin: 4.4 g/dL (ref 3.9–4.9)
Alkaline Phosphatase: 70 IU/L (ref 44–121)
BUN/Creatinine Ratio: 15 (ref 9–23)
BUN: 11 mg/dL (ref 6–24)
Bilirubin Total: 0.3 mg/dL (ref 0.0–1.2)
CO2: 20 mmol/L (ref 20–29)
Calcium: 9.2 mg/dL (ref 8.7–10.2)
Chloride: 102 mmol/L (ref 96–106)
Creatinine, Ser: 0.72 mg/dL (ref 0.57–1.00)
Globulin, Total: 2.9 g/dL (ref 1.5–4.5)
Glucose: 226 mg/dL — ABNORMAL HIGH (ref 70–99)
Potassium: 3.5 mmol/L (ref 3.5–5.2)
Sodium: 139 mmol/L (ref 134–144)
Total Protein: 7.3 g/dL (ref 6.0–8.5)
eGFR: 106 mL/min/{1.73_m2} (ref >59)

## 2023-02-02 LAB — LIPID PANEL
Chol/HDL Ratio: 4.3 ratio (ref 0.0–4.4)
Cholesterol, Total: 188 mg/dL (ref 100–199)
HDL: 44 mg/dL (ref >39)
LDL Chol Calc (NIH): 115 mg/dL — ABNORMAL HIGH (ref 0–99)
Triglycerides: 167 mg/dL — ABNORMAL HIGH (ref 0–149)
VLDL Cholesterol Cal: 29 mg/dL (ref 5–40)

## 2023-02-02 LAB — HEMOGLOBIN A1C
Est. average glucose Bld gHb Est-mCnc: 143 mg/dL
Hgb A1c MFr Bld: 6.6 % — ABNORMAL HIGH (ref 4.8–5.6)

## 2023-02-02 NOTE — Telephone Encounter (Signed)
Called patient and was unable to leave voicemail to discuss results, will attempt to call again next week. A1c of 6.6, will most likely need to start metformin for diabetes.   Glendale Chard, DO [facility], PGY-1 02/02/23 5:39 PM

## 2023-02-08 ENCOUNTER — Telehealth: Payer: Self-pay | Admitting: Student

## 2023-02-08 NOTE — Telephone Encounter (Signed)
Discussed results of A1c. Patient now in diabetic range. Offered to start patient on low dose metformin. She would prefer to schedule an appointment with PCP to discuss results. Scheduled Appointment with Dr. Larita Fife on 5/13 at 8:30 AM.   Carol Chard, DO [facility], PGY-1 02/08/23 8:41 AM

## 2023-02-15 ENCOUNTER — Ambulatory Visit (INDEPENDENT_AMBULATORY_CARE_PROVIDER_SITE_OTHER): Payer: Medicaid Other | Admitting: Obstetrics and Gynecology

## 2023-02-15 ENCOUNTER — Other Ambulatory Visit: Payer: Self-pay

## 2023-02-15 ENCOUNTER — Encounter: Payer: Self-pay | Admitting: Obstetrics and Gynecology

## 2023-02-15 ENCOUNTER — Other Ambulatory Visit (HOSPITAL_COMMUNITY)
Admission: RE | Admit: 2023-02-15 | Discharge: 2023-02-15 | Disposition: A | Discharge status: Home / Self Care | Payer: Medicaid Other | Source: Ambulatory Visit | Attending: Obstetrics and Gynecology | Admitting: Obstetrics and Gynecology

## 2023-02-15 VITALS — BP 129/86 | HR 76 | Ht 59.0 in | Wt 125.7 lb

## 2023-02-15 DIAGNOSIS — Z124 Encounter for screening for malignant neoplasm of cervix: Secondary | ICD-10-CM

## 2023-02-15 DIAGNOSIS — Z3040 Encounter for surveillance of contraceptives, unspecified: Secondary | ICD-10-CM

## 2023-02-15 DIAGNOSIS — Z113 Encounter for screening for infections with a predominantly sexual mode of transmission: Secondary | ICD-10-CM | POA: Diagnosis not present

## 2023-02-15 DIAGNOSIS — Z3046 Encounter for surveillance of implantable subdermal contraceptive: Secondary | ICD-10-CM

## 2023-02-15 HISTORY — PX: INSERTION OF IMPLANON ROD: OBO 1005

## 2023-02-15 HISTORY — PX: REMOVAL OF IMPLANON ROD: OBO 1006

## 2023-02-15 MED ORDER — ETONOGESTREL 68 MG ~~LOC~~ IMPL
68.0000 mg | DRUG_IMPLANT | Freq: Once | SUBCUTANEOUS | Status: AC
  Administered 2023-02-15: 68 mg via SUBCUTANEOUS

## 2023-02-15 NOTE — Procedures (Signed)
Nexplanon Removal Procedure Note Prior Nexplanon placed 11/11/2019. She desires another one today; she does have periods with it. Pap due for pap smear and is amenable to screening.   Patient amenable to pap smear. EGBUS, vagina and cervix wnl.   After informed consent was obtained, the patient's left arm was examined and the Nexplanon rod was noted to be easily palpable approximately 8 cm proximal to the medial epicondyle adjacent to the outer triceps . The area was cleaned with alcohol then local anesthesia was infiltrated with 3 ml of 1% lidocaine with epinephrine. The area was prepped with betadine. Using sterile technique, an 11 blade was used to make an incision , and the Nexplanon device was brought to the incision site. The capsule was scrapped off with the scalpel, the Nexplanon grasped with hemostats, and easily removed; the removal site was hemostatic. The Nexplanon was inspected and noted to be intact.   The area was cleaned with betadine then local anesthesia was infiltrated with 3 ml of 1% lidocaine along the planned insertion track. Using sterile technique the Nexplanon device was inserted per manufacturer's guidelines in the subdermal connective tissue using the standard insertion technique without difficulty. Pressure was applied and the insertion site was hemostatic. The presence of the Nexplanon was confirmed immediately after insertion by palpation by both me and the patient and by checking the tip of needle for the absence of the insert.  A pressure dressing was applied.  The patient tolerated the procedure well.  Cornelia Copa MD Attending Center for [facility])

## 2023-02-15 NOTE — Progress Notes (Signed)
Obstetrics and Gynecology Visit Return Patient Evaluation  Appointment Date: 02/15/2023  Primary Care Provider: Fayette Pho  OBGYN Clinic: Center for [facility]  Chief Complaint: expired nexplanon  History of Present Illness:  Carol Harrington is a 42 y.o. with expired nexplanon (placed 11/2019). Pt also due for pap smear. She likes the nexplanon and desires another one.   Review of Systems: as noted in the History of Present Illness.   Patient Active Problem List   Diagnosis Date Noted   Dizziness 02/01/2023   Need for immunization against influenza 08/18/2022   Chiari malformation type I (HCC) 08/16/2022   Foraminal stenosis of cervical region 08/16/2022   Bulge of cervical disc without myelopathy 08/16/2022   Sinusitis 07/17/2021   Nexplanon in place 10/18/2020   History of gestational diabetes 10/18/2020   Microalbuminuria 11/07/2019   Pre-diabetes 11/06/2019   Medications:  Karlynn Tamura had no medications administered during this visit. No current outpatient medications on file.   Current Facility-Administered Medications  Medication Dose Route Frequency Provider Last Rate Last Admin   etonogestrel (NEXPLANON) implant 68 mg  68 mg Subdermal Once [place] Bing, MD        Allergies: has No Known Allergies.  Physical Exam:  BP 129/86   Pulse 76   Ht 4\' 11"  (1.499 m)   Wt 125 lb 11.2 oz (57 kg)   BMI 25.39 kg/m  Body mass index is 25.39 kg/m. General appearance: Well nourished, well developed female in no acute distress.  Abdomen: diffusely non tender to palpation, non distended, and no masses, hernias Neuro/Psych:  Normal mood and affect.    Pelvic exam:  Cervical exam performed in the presence of a chaperone EGBUS: wnl Vaginal vault: wnl Cervix: wnl Bimanual: neg  See procedure note for uncomplicated Nexplanon removal and new nexplanon placement  Labs: UPT neg Assessment: pt doing well  Plan:  1. Cervical cancer screening -  Cytology - PAP( [place])  2. Encounter for surveillance of contraceptive device - etonogestrel (NEXPLANON) implant 68 mg  3. Encounter for removal and reinsertion of Nexplanon   RTC: PRN  Return if symptoms worsen or fail to improve.  Future Appointments  Date Time Provider Department Center  02/19/2023  8:30 AM Fayette Pho, MD [facility]    Cornelia Copa MD Attending Center for [facility])

## 2023-02-17 NOTE — Progress Notes (Unsigned)
    SUBJECTIVE:   CHIEF COMPLAINT / HPI:   Prediabetes Previously only in pre-diabetic range.  First DM range A1c was 4/25, last week Currently managed with diet and exercise. Urine microalbumin recently within the moderately elevated, increased from prior  Lab Results  Component Value Date   HGBA1C 6.6 (H) 02/01/2023   HGBA1C 6.1 01/26/2022   HGBA1C 6.0 07/15/2021   Lab Results  Component Value Date   LDLCALC 115 (H) 02/01/2023   CREATININE 0.72 02/01/2023   Lab Results  Component Value Date   LABMICR 134.3 02/01/2023   LABMICR 8.4 11/06/2019   Epigastric discomfort No red flags - denies globus sensation, early satiety, coughing or choking with food, weight loss, diarrhea or constipation Pain does not radiate anywhere Symptoms only with food, not with exertion  Better with exercise Has not yet tried any OTC medications  PERTINENT  PMH / PSH:  Patient Active Problem List   Diagnosis Date Noted   Heartburn 02/19/2023   Dizziness 02/01/2023   Need for immunization against influenza 08/18/2022   Chiari malformation type I (HCC) 08/16/2022   Foraminal stenosis of cervical region 08/16/2022   Bulge of cervical disc without myelopathy 08/16/2022   Sinusitis 07/17/2021   Nexplanon in place 10/18/2020   History of gestational diabetes 10/18/2020   Microalbuminuria 11/07/2019   Pre-diabetes 11/06/2019    OBJECTIVE:   BP 122/83   Pulse 78   Ht 4\' 11"  (1.499 m)   Wt 126 lb (57.2 kg)   SpO2 100%   BMI 25.45 kg/m    PHQ-9:     02/19/2023    9:03 AM 08/17/2022    8:44 AM 06/08/2022   10:26 AM  Depression screen PHQ 2/9  Decreased Interest 1 1 1   Down, Depressed, Hopeless 1 0 1  PHQ - 2 Score 2 1 2   Altered sleeping 1 0 1  Tired, decreased energy 1 1 1   Change in appetite 0 0 0  Feeling bad or failure about yourself  0 0 0  Trouble concentrating 1 1 0  Moving slowly or fidgety/restless 0 0 0  Suicidal thoughts 0 0 0  PHQ-9 Score 5 3 4   Difficult doing  work/chores Not difficult at all Not difficult at all Not difficult at all   Physical Exam General: Awake, alert, oriented Cardiovascular: Regular rate and rhythm, S1 and S2 present, no murmurs auscultated Respiratory: Lung fields clear to auscultation bilaterally Abdomen: Nondistended, soft, no TTP, rebound, or guarding  ASSESSMENT/PLAN:   Pre-diabetes A1c last week within diabetic range.  Patient would like to start medicines at this time, discussed that we do not formally diagnose yet unless she has to consecutive measurements in the diabetic range. - Start metformin-empagliflozin combination pill - Return in 3 months for repeat A1c  Microalbuminuria Will start empagliflozin as above in prediabetes A&P.   Heartburn No red flags. Epigastric discomfort most consistent with acid reflux. Will trial pepcid x 3-4 weeks. If no improvement, can consider H. Pylori testing.      Fayette Pho, MD [facility]

## 2023-02-17 NOTE — Patient Instructions (Signed)
It was wonderful to see you today. Thank you for allowing me to be a part of your care. Below is a short summary of what we discussed at your visit today:  Pre-diabetes, worsened We need two elevated measurements to formally diagnose diabetes.  Please return for a recheck in 3 months, which should be end of July or early August.  If at that time your A1c is still elevated, we will formally diagnose with diabetes.   START the combination medicine with metformin and empagliflozin.  The metformin will stop you from absorbing sugar in your intestines and the empagliflozin will help you to pee out sugar.  These medicines together will help reduce your blood sugar and also protect your kidneys.  Heart burn START famotidine once daily. If after one week it is not helping much, increase to twice daily.  We will try 3 to 4 weeks of this to see if this helps.  You may also try over-the-counter medicine such as Tums or Gaviscon to help.  If you do not get any relief with the above, please return for reevaluation.   Primary care doctor graduating soon! I am graduating June 28th. You will be assigned a new PCP.  Thank you for letting me care for you! It has been wonderful getting to know you.   Please bring all of your medications to every appointment!  If you have any questions or concerns, please do not hesitate to contact us via phone or MyChart message.   Fayette Pho, MD

## 2023-02-19 ENCOUNTER — Ambulatory Visit: Payer: Medicaid Other | Admitting: Family Medicine

## 2023-02-19 ENCOUNTER — Encounter: Payer: Self-pay | Admitting: Family Medicine

## 2023-02-19 VITALS — BP 122/83 | HR 78 | Ht 59.0 in | Wt 126.0 lb

## 2023-02-19 DIAGNOSIS — R7303 Prediabetes: Secondary | ICD-10-CM

## 2023-02-19 DIAGNOSIS — R12 Heartburn: Secondary | ICD-10-CM | POA: Diagnosis not present

## 2023-02-19 DIAGNOSIS — R809 Proteinuria, unspecified: Secondary | ICD-10-CM

## 2023-02-19 DIAGNOSIS — Z8632 Personal history of gestational diabetes: Secondary | ICD-10-CM

## 2023-02-19 LAB — CYTOLOGY - PAP
Chlamydia: NEGATIVE
Comment: NEGATIVE
Comment: NEGATIVE
Comment: NEGATIVE
Comment: NORMAL
Diagnosis: NEGATIVE
High risk HPV: NEGATIVE
Neisseria Gonorrhea: NEGATIVE
Trichomonas: NEGATIVE

## 2023-02-19 MED ORDER — FAMOTIDINE 20 MG PO TABS
20.0000 mg | ORAL_TABLET | Freq: Every morning | ORAL | 1 refills | Status: DC
Start: 2023-02-19 — End: 2023-10-13

## 2023-02-19 MED ORDER — EMPAGLIFLOZIN-METFORMIN HCL 12.5-1000 MG PO TABS: 1.0000 | ORAL_TABLET | Freq: Every day | ORAL | 11 refills | Status: DC

## 2023-02-19 NOTE — Assessment & Plan Note (Signed)
Will start empagliflozin as above in prediabetes A&P.

## 2023-02-19 NOTE — Assessment & Plan Note (Signed)
No red flags. Epigastric discomfort most consistent with acid reflux. Will trial pepcid x 3-4 weeks. If no improvement, can consider H. Pylori testing.

## 2023-02-19 NOTE — Assessment & Plan Note (Signed)
A1c last week within diabetic range.  Patient would like to start medicines at this time, discussed that we do not formally diagnose yet unless she has to consecutive measurements in the diabetic range. - Start metformin-empagliflozin combination pill - Return in 3 months for repeat A1c

## 2023-02-19 NOTE — Assessment & Plan Note (Signed)
>>  ASSESSMENT AND PLAN FOR PRE-DIABETES WRITTEN ON 02/19/2023  9:00 AM BY LYNN, CATHERINE, MD  A1c last week within diabetic range.  Patient would like to start medicines at this time, discussed that we do not formally diagnose yet unless she has to consecutive measurements in the diabetic range. - Start metformin-empagliflozin combination pill - Return in 3 months for repeat A1c

## 2023-02-22 ENCOUNTER — Telehealth: Payer: Self-pay

## 2023-02-22 DIAGNOSIS — R7303 Prediabetes: Secondary | ICD-10-CM

## 2023-02-22 DIAGNOSIS — R809 Proteinuria, unspecified: Secondary | ICD-10-CM

## 2023-02-22 NOTE — Telephone Encounter (Signed)
A Prior Authorization was initiated for this patients SYNJARDY through CoverMyMeds.   Key: [id number]

## 2023-02-23 NOTE — Telephone Encounter (Signed)
Prior Auth for patients medication SYNJARDY denied by [employer] via CoverMyMeds.   Reason:   CoverMyMeds Key: [id number]

## 2023-02-23 NOTE — Telephone Encounter (Signed)
A Prior Authorization was RE-initiated for this patients SYNJARDY through CoverMyMeds.   Key: Carol Harrington

## 2023-02-24 NOTE — Telephone Encounter (Signed)
Prior Auth for patients medication SYNJARDY denied by [employer] via CoverMyMeds.   Reason:   Still the same rejection, needs dx of type 2 diabetes.  CoverMyMeds Key: [id number]

## 2023-02-25 ENCOUNTER — Encounter: Payer: Self-pay | Admitting: Family Medicine

## 2023-02-25 MED ORDER — METFORMIN HCL 1000 MG PO TABS
1000.0000 mg | ORAL_TABLET | Freq: Two times a day (BID) | ORAL | 2 refills | Status: DC
Start: 2023-02-25 — End: 2023-10-13

## 2023-02-25 NOTE — Telephone Encounter (Signed)
Since we cannot get the synjardy covered, we will simply pursue metformin monotherapy.   Fayette Pho, MD

## 2023-02-25 NOTE — Addendum Note (Signed)
Addended by: Valetta Close on: 02/25/2023 07:15 AM   Modules accepted: Orders

## 2023-06-28 ENCOUNTER — Encounter: Payer: Self-pay | Admitting: Student

## 2023-06-28 ENCOUNTER — Ambulatory Visit: Payer: Medicaid Other | Admitting: Student

## 2023-06-28 VITALS — BP 128/88 | HR 78 | Temp 98.8°F | Wt 127.0 lb

## 2023-06-28 DIAGNOSIS — R7303 Prediabetes: Secondary | ICD-10-CM

## 2023-06-28 DIAGNOSIS — Z23 Encounter for immunization: Secondary | ICD-10-CM | POA: Diagnosis not present

## 2023-06-28 DIAGNOSIS — E119 Type 2 diabetes mellitus without complications: Secondary | ICD-10-CM

## 2023-06-28 LAB — POCT GLYCOSYLATED HEMOGLOBIN (HGB A1C): HbA1c, POC (prediabetic range): 6.6 % — AB (ref 5.7–6.4)

## 2023-06-28 MED ORDER — LISINOPRIL 2.5 MG PO TABS
2.5000 mg | ORAL_TABLET | Freq: Every day | ORAL | 3 refills | Status: DC
Start: 2023-06-28 — End: 2024-06-27

## 2023-06-28 MED ORDER — SITAGLIPTIN PHOSPHATE 25 MG PO TABS
25.0000 mg | ORAL_TABLET | Freq: Every day | ORAL | 3 refills | Status: DC
Start: 2023-06-28 — End: 2024-08-30

## 2023-06-28 NOTE — Patient Instructions (Addendum)
It was wonderful to meet you today. Thank you for allowing me to be a part of your care. Below is a short summary of what we discussed at your visit today:  Your A1c today was 6.6. Unchanged from 4 months ago  Given that you are having diarrhea with metformin I will switch her medication to Januvia which you will take 25 mg daily.  Pressure today was slightly elevated and your last microalbuminuria was elevated so I started you on a blood pressure medication lisinopril which you will take 2.5 mg daily  Also placed a referral for an ophthalmologist for annual eye exam.  Please follow-up in 3 months.  If you have any questions or concerns, please do not hesitate to contact us via phone or MyChart message.   Jerre Simon, MD [facility]

## 2023-06-28 NOTE — Progress Notes (Signed)
    SUBJECTIVE:   CHIEF COMPLAINT / HPI:   Diabetes, Type 2 - Last A1c -6.6 from 4 months ago - Medications: Metformin 1000 mg twice daily - Compliance: Stopped taking metformin after a week due to diarrhea. - Checking BG at home: No - Diet: okay, Increased vegetables and fruits - Exercise: Walks daily 2-3 times a week - Eye exam: Due - Microalbumin: Recently completed and abnormal - Statin: None - Denies symptoms of hypoglycemia, polyuria, polydipsia, numbness extremities, foot ulcers/trauma   PERTINENT  PMH / PSH: Reviewed  OBJECTIVE:   BP 128/88   Pulse 78   Temp 98.8 F (37.1 C)   Wt 127 lb (57.6 kg)   SpO2 100%   BMI 25.65 kg/m    Physical Exam General: Alert, well appearing, NAD Eye: PERRL, no scleral icterus Cardiovascular: RRR, No Murmurs, Normal S2/S2 Respiratory: CTAB, No wheezing or Rales Abdomen: No distension or tenderness Extremities: No edema on extremities    ASSESSMENT/PLAN:   Type 2 diabetes mellitus without complication, without long-term current use of insulin (HCC) A1C today was unchanged from 4 months ago at 6.6%.  Patient has been noncompliant with medications due to side effects.  She reports bouts of diarrhea on metformin prompting her to discontinue the medication.  No hypoglycemic or hyperglycemic episodes per patient and does noT check home blood glucose.  Does endorse some vision changes although unable to describe it.  Physical eye exam was unremarkable.  Will send patient to ophthalmologist. -Obtained A1c -Discontinued metformin and started patient on Januvia 25 mg daily -Given elevated microalbuminuria/creatinine ratio, started patient on lisinopril 2.5 mg daily -Referral to ophthalmology     Jerre Simon, MD [facility]

## 2023-06-28 NOTE — Assessment & Plan Note (Addendum)
A1C today was unchanged from 4 months ago at 6.6%.  Patient has been noncompliant with medications due to side effects.  She reports bouts of diarrhea on metformin prompting her to discontinue the medication.  No hypoglycemic or hyperglycemic episodes per patient and does noT check home blood glucose.  Does endorse some vision changes although unable to describe it.  Physical eye exam was unremarkable.  Will send patient to ophthalmologist. -Obtained A1c -Discontinued metformin and started patient on Januvia 25 mg daily -Given elevated microalbuminuria/creatinine ratio, started patient on lisinopril 2.5 mg daily -Referral to ophthalmology

## 2023-10-13 ENCOUNTER — Ambulatory Visit (HOSPITAL_COMMUNITY)
Admission: EM | Admit: 2023-10-13 | Discharge: 2023-10-13 | Disposition: A | Discharge status: Home / Self Care | Payer: Medicaid Other | Attending: Physician Assistant | Admitting: Physician Assistant

## 2023-10-13 ENCOUNTER — Encounter (HOSPITAL_COMMUNITY): Payer: Self-pay

## 2023-10-13 DIAGNOSIS — J320 Chronic maxillary sinusitis: Secondary | ICD-10-CM | POA: Diagnosis not present

## 2023-10-13 MED ORDER — FLUTICASONE PROPIONATE 50 MCG/ACT NA SUSP: 1.0000 | Freq: Every day | NASAL | 0 refills | Status: DC

## 2023-10-13 MED ORDER — AMOXICILLIN 500 MG PO CAPS: 500.0000 mg | ORAL_CAPSULE | Freq: Three times a day (TID) | ORAL | 0 refills | Status: DC

## 2023-10-13 NOTE — ED Triage Notes (Signed)
 Runny nose, throat irritation x 14 days. Slight cough, no fever. No known sick exposure.   Patient tried tylenol with no relief.

## 2023-10-13 NOTE — ED Provider Notes (Signed)
 [facility]    CSN: [id number] Arrival date & time: 10/13/23  1707      History   Chief Complaint Chief Complaint  Patient presents with   Sore Throat   Nasal Congestion    HPI Carol Harrington is a 43 y.o. female.   Patient complains of sinus drainage and congestion for the past 2 weeks.  Patient reports that she has drainage down the back of her throat which is causing her to have a sore throat.  Patient denies any shortness of breath.  Patient reports she has had sinus infections in the past.  The history is provided by the patient. No language interpreter was used.  Sore Throat    Past Medical History:  Diagnosis Date   Breast complaint 10/18/2020   Patient brought up at new patient appt 10/18/2020. Onset within a couple days.    COVID-19 virus detected 03/18/2019   Positive March 15, 2019    Dizziness, nonspecific 10/18/2020   Encounter for initial prescription of Nexplanon  11/11/2019   GDM, class A2 02/12/2019   Current Diabetic Medications:  None  [x]  Aspirin  81 mg daily after 12 weeks (? A2/B GDM)  For A2/B GDM or higher classes of DM [x ] Diabetes Education and Testing Supplies [ ]  Nutrition Counsult [x]  Fetal ECHO after 20 weeks  [ ]  Eye exam for retina evaluation   Baseline and surveillance labs (pulled in from [facility], refresh links as needed)  Lab Results Component Value Date  CREATININE 0.7 06/26/200   Gestational diabetes    Hypertension 10/18/2020   Patient started on losartan  25 mg daily after delivering last child in 2021. Patient unsure if she was hypertensive outside of pregnancy.    IUD (intrauterine device) in place 08/11/2019   10/20  liletta    Paresthesia of right upper and lower extremity 01/26/2022    Patient Active Problem List   Diagnosis Date Noted   Type 2 diabetes mellitus without complication, without long-term current use of insulin (HCC) 06/28/2023   Heartburn 02/19/2023   Dizziness 02/01/2023   Need for immunization against influenza  08/18/2022   Chiari malformation type I (HCC) 08/16/2022   Foraminal stenosis of cervical region 08/16/2022   Bulge of cervical disc without myelopathy 08/16/2022   Sinusitis 07/17/2021   Nexplanon  in place 10/18/2020   History of gestational diabetes 10/18/2020   Microalbuminuria 11/07/2019   Pre-diabetes 11/06/2019    Past Surgical History:  Procedure Laterality Date   INSERTION OF IMPLANON  ROD  02/15/2023   NO PAST SURGERIES     None     REMOVAL OF IMPLANON  ROD  02/15/2023    OB History     Gravida  6   Para  5   Term  5   Preterm  0   AB  1   Living  5      SAB  1   IAB  0   Ectopic  0   Multiple  0   Live Births  5            Home Medications    Prior to Admission medications   Medication Sig Start Date End Date Taking? Authorizing Provider  lisinopril  (ZESTRIL ) 2.5 MG tablet Take 1 tablet (2.5 mg total) by mouth at bedtime. 06/28/23  Yes Rosendo Rush, MD  sitaGLIPtin  (JANUVIA ) 25 MG tablet Take 1 tablet (25 mg total) by mouth daily. 06/28/23  Yes Rosendo Rush, MD    Family History Family History  Problem Relation  Age of Onset   Hypertension Father    Stroke Father     Social History Social History   Tobacco Use   Smoking status: Never   Smokeless tobacco: Never  Vaping Use   Vaping status: Never Used  Substance Use Topics   Alcohol use: No   Drug use: No     Allergies   Patient has no known allergies.   Review of Systems Review of Systems  All other systems reviewed and are negative.    Physical Exam Triage Vital Signs ED Triage Vitals  Encounter Vitals Group     BP 10/13/23 1802 126/79     Systolic BP Percentile --      Diastolic BP Percentile --      Pulse Rate 10/13/23 1802 83     Resp 10/13/23 1802 16     Temp 10/13/23 1802 98.6 F (37 C)     Temp Source 10/13/23 1802 Oral     SpO2 10/13/23 1802 98 %     Weight 10/13/23 1801 130 lb (59 kg)     Height 10/13/23 1801 4' 11 (1.499 m)     Head Circumference --       Peak Flow --      Pain Score 10/13/23 1800 6     Pain Loc --      Pain Education --      Exclude from Growth Chart --    No data found.  Updated Vital Signs BP 126/79 (BP Location: Right Arm)   Pulse 83   Temp 98.6 F (37 C) (Oral)   Resp 16   Ht 4' 11 (1.499 m)   Wt 59 kg   LMP  (LMP Unknown)   SpO2 98%   Breastfeeding Yes   BMI 26.26 kg/m   Visual Acuity Right Eye Distance:   Left Eye Distance:   Bilateral Distance:    Right Eye Near:   Left Eye Near:    Bilateral Near:     Physical Exam Vitals and nursing note reviewed.  Constitutional:      Appearance: She is well-developed.  HENT:     Head: Normocephalic.     Right Ear: Tympanic membrane normal.     Left Ear: Tympanic membrane normal.     Mouth/Throat:     Mouth: Mucous membranes are moist.  Cardiovascular:     Rate and Rhythm: Normal rate.  Pulmonary:     Effort: Pulmonary effort is normal.  Abdominal:     General: There is no distension.  Musculoskeletal:        General: Normal range of motion.     Cervical back: Normal range of motion.  Skin:    General: Skin is warm.  Neurological:     Mental Status: She is alert and oriented to person, place, and time.      UC Treatments / Results  Labs (all labs ordered are listed, but only abnormal results are displayed) Labs Reviewed - No data to display  EKG   Radiology No results found.  Procedures Procedures (including critical care time)  Medications Ordered in UC Medications - No data to display  Initial Impression / Assessment and Plan / UC Course  I have reviewed the triage vital signs and the nursing notes.  Pertinent labs & imaging results that were available during my care of the patient were reviewed by me and considered in my medical decision making (see chart for details).      Final Clinical  Impressions(s) / UC Diagnoses   Final diagnoses:  Chronic maxillary sinusitis   Discharge Instructions   None    ED  Prescriptions     Medication Sig Dispense Auth. Provider   amoxicillin  (AMOXIL ) 500 MG capsule Take 1 capsule (500 mg total) by mouth 3 (three) times daily. 30 capsule Jacquline Terrill K, PA-C   fluticasone  (FLONASE ) 50 MCG/ACT nasal spray Place 1 spray into both nostrils daily. 9.9 mL Ting Cage K, PA-C     An After Visit Summary was printed and given to the patient.  PDMP not reviewed this encounter.   Flint Sonny POUR, PA-C 10/13/23 1810

## 2023-10-25 ENCOUNTER — Ambulatory Visit: Payer: Medicaid Other | Admitting: Student

## 2023-10-25 VITALS — BP 110/76 | HR 95 | Wt 128.4 lb

## 2023-10-25 DIAGNOSIS — E1169 Type 2 diabetes mellitus with other specified complication: Secondary | ICD-10-CM | POA: Diagnosis not present

## 2023-10-25 DIAGNOSIS — E785 Hyperlipidemia, unspecified: Secondary | ICD-10-CM | POA: Diagnosis not present

## 2023-10-25 DIAGNOSIS — E119 Type 2 diabetes mellitus without complications: Secondary | ICD-10-CM | POA: Diagnosis not present

## 2023-10-25 LAB — POCT GLYCOSYLATED HEMOGLOBIN (HGB A1C): HbA1c, POC (controlled diabetic range): 6.6 % (ref 0.0–7.0)

## 2023-10-25 NOTE — Assessment & Plan Note (Addendum)
well controlled.  A1c 6.6. - Medications: Januvia 25 mg daily, lisinopril 2.5 mg daily.  Blood pressure is stable on lisinopril. -Ophthalmology referral placed prior however patient has not had follow-up.  Will discuss with referral coordinator.

## 2023-10-25 NOTE — Progress Notes (Signed)
  SUBJECTIVE:   CHIEF COMPLAINT / HPI:   Type 2 Diabetes: Home medications include: Januvia 25 mg daily. Does endorse compliance. Home glucose monitoring is performed sporadically.  She notes her glucoses usually in the 100s.  Patient is not up to date on diabetic eye.  PERTINENT  PMH / PSH: Chiari malformation, T2DM  OBJECTIVE:  BP 110/76   Pulse 95   Wt 128 lb 6.4 oz (58.2 kg)   LMP  (LMP Unknown)   BMI 25.93 kg/m  General: Well-appearing, NAD CV: RRR, murmurs auscultated Abdomen: Soft, nontender, normoactive bowel sounds  ASSESSMENT/PLAN:   Assessment & Plan Type 2 diabetes mellitus without complication, without long-term current use of insulin (HCC) well controlled.  A1c 6.6. - Medications: Januvia 25 mg daily, lisinopril 2.5 mg daily.  Blood pressure is stable on lisinopril. -Ophthalmology referral placed prior however patient has not had follow-up.  Will discuss with referral coordinator. Hyperlipidemia associated with type 2 diabetes mellitus (HCC) LDL 115, goal <90 given she has diabetes.  Recheck lipid panel today, if still elevated recommend starting statin and dietary changes.  Then will need to recheck lipid panel in 3 months when she will next be due for A1c check. Return in about 3 months (around 01/23/2024). Shelby Mattocks, DO 10/25/2023, 5:18 PM PGY-3, [facility]

## 2023-10-25 NOTE — Patient Instructions (Addendum)
It was great to see you today! Thank you for choosing [facility] for your primary care.  Today we addressed: Your A1c is 6.6.  This is excellent, please continue medications as is.  I will follow-up with the referral coordinator regarding the ophthalmology referral she can have an eye doctor.  I am also checking your cholesterol levels and that will guide [place] and whether we should start a cholesterol medication or do strictly dietary changes.  If you haven't already, sign up for My Chart to have easy access to your labs results, and communication with your primary care physician. We are checking some labs today. If they are abnormal, I will call you. If they are normal, I will send you a MyChart message (if it is active) or a letter in the mail. If you do not hear about your labs in the next 2 weeks, please call the office. Return in about 3 months (around 01/23/2024). Please arrive 15 minutes before your appointment to ensure smooth check in process.  We appreciate your efforts in making this happen.  Thank you for allowing me to participate in your care, Shelby Mattocks, DO 10/25/2023, 3:47 PM PGY-3, [facility]

## 2023-10-25 NOTE — Assessment & Plan Note (Signed)
LDL 115, goal <90 given she has diabetes.  Recheck lipid panel today, if still elevated recommend starting statin and dietary changes.  Then will need to recheck lipid panel in 3 months when she will next be due for A1c check.

## 2023-10-26 ENCOUNTER — Other Ambulatory Visit: Payer: Self-pay | Admitting: Student

## 2023-10-26 ENCOUNTER — Encounter: Payer: Self-pay | Admitting: Student

## 2023-10-26 DIAGNOSIS — E1169 Type 2 diabetes mellitus with other specified complication: Secondary | ICD-10-CM

## 2023-10-26 LAB — LIPID PANEL
Chol/HDL Ratio: 3.9 {ratio} (ref 0.0–4.4)
Cholesterol, Total: 198 mg/dL (ref 100–199)
HDL: 51 mg/dL (ref >39)
LDL Chol Calc (NIH): 133 mg/dL — ABNORMAL HIGH (ref 0–99)
Triglycerides: 75 mg/dL (ref 0–149)
VLDL Cholesterol Cal: 14 mg/dL (ref 5–40)

## 2023-10-26 MED ORDER — ATORVASTATIN CALCIUM 40 MG PO TABS: 40.0000 mg | ORAL_TABLET | Freq: Every day | ORAL | 3 refills | Status: DC

## 2023-10-29 NOTE — Telephone Encounter (Signed)
Patient calls nurse line reporting she missed a call last week.   Lab results discussed with patient and the need to start Atorvastatin.   Recheck labs in 3 months.   Patient agreed with plan.

## 2024-02-21 ENCOUNTER — Ambulatory Visit: Admitting: Student

## 2024-02-21 ENCOUNTER — Encounter: Payer: Self-pay | Admitting: Student

## 2024-02-21 VITALS — BP 115/84 | HR 98 | Ht 59.0 in | Wt 128.0 lb

## 2024-02-21 DIAGNOSIS — E119 Type 2 diabetes mellitus without complications: Secondary | ICD-10-CM

## 2024-02-21 DIAGNOSIS — E785 Hyperlipidemia, unspecified: Secondary | ICD-10-CM

## 2024-02-21 DIAGNOSIS — E1169 Type 2 diabetes mellitus with other specified complication: Secondary | ICD-10-CM | POA: Diagnosis not present

## 2024-02-21 LAB — POCT GLYCOSYLATED HEMOGLOBIN (HGB A1C): HbA1c, POC (controlled diabetic range): 6.5 % (ref 0.0–7.0)

## 2024-02-21 NOTE — Assessment & Plan Note (Signed)
 Well-controlled, A1c 6.5. Adherent on Januvia  25 mg daily, lisinopril  2.5 mg daily for renal protection. Obtain urine microalbumin/creatinine ratio and BMP for her eGFR. Provided information for ophthalmology for diabetic eye exam

## 2024-02-21 NOTE — Patient Instructions (Addendum)
 It was great to see you today! Thank you for choosing [facility] for your primary care.  Today we addressed: Diabetes: The information for the ophthalmology referral as below.  Your A1c was 6.5, this is well-controlled and we will continue on the medications you are taking. We are rechecking some labs today.  [facility] Address: [address], [place], [place] [address] Phone: [phone] They will call pt to schedule an appointment.  If you haven't already, sign up for My Chart to have easy access to your labs results, and communication with your primary care physician. We are checking some labs today. If they are abnormal, I will call you. If they are normal, I will send you a MyChart message (if it is active) or a letter in the mail. If you do not hear about your labs in the next 2 weeks, please call the office. Return in about 4 months (around 06/23/2024) for Diabetes follow-up, meet new PCP. Please arrive 15 minutes before your appointment to ensure smooth check in process.  We appreciate your efforts in making this happen.  Thank you for allowing me to participate in your care, Veronia Goon, DO 02/21/2024, 1:45 PM PGY-3, [facility]

## 2024-02-21 NOTE — Assessment & Plan Note (Signed)
 Adherent to atorvastatin  40 mg daily, recheck LDL today with goal <90.

## 2024-02-21 NOTE — Progress Notes (Signed)
  SUBJECTIVE:   CHIEF COMPLAINT / HPI:   Type 2 Diabetes: Home medications include: Januvia  25mg  daily. Does endorse compliance. Patient is not up to date on diabetic eye.  HLD: Initiated atorvastatin  at last visit. Tolerating well.   PERTINENT  PMH / PSH: Chiari malformation, T2DM, HLD, nexplanon  in place  OBJECTIVE:  BP 115/84   Pulse 98   Ht 4\' 11"  (1.499 m)   Wt 128 lb (58.1 kg)   SpO2 99%   Breastfeeding No   BMI 25.85 kg/m  General: Well-appearing, NAD CV: RRR, murmurs appreciable Pulm: Normal WOB  ASSESSMENT/PLAN:   Assessment & Plan Type 2 diabetes mellitus without complication, without long-term current use of insulin (HCC) Well-controlled, A1c 6.5. Adherent on Januvia  25 mg daily, lisinopril  2.5 mg daily for renal protection. Obtain urine microalbumin/creatinine ratio and BMP for her eGFR. Provided information for ophthalmology for diabetic eye exam Hyperlipidemia associated with type 2 diabetes mellitus (HCC) Adherent to atorvastatin  40 mg daily, recheck LDL today with goal <90. Return in about 4 months (around 06/23/2024) for Diabetes follow-up, meet new PCP. Veronia Goon, DO 02/21/2024, 2:19 PM PGY-3, [facility]

## 2024-02-22 ENCOUNTER — Ambulatory Visit: Payer: Self-pay | Admitting: Student

## 2024-02-22 LAB — LDL CHOLESTEROL, DIRECT: LDL Direct: 65 mg/dL (ref 0–99)

## 2024-02-22 LAB — BASIC METABOLIC PANEL WITH GFR
BUN/Creatinine Ratio: 15 (ref 9–23)
BUN: 10 mg/dL (ref 6–24)
CO2: 22 mmol/L (ref 20–29)
Calcium: 9.3 mg/dL (ref 8.7–10.2)
Chloride: 101 mmol/L (ref 96–106)
Creatinine, Ser: 0.67 mg/dL (ref 0.57–1.00)
Glucose: 171 mg/dL — ABNORMAL HIGH (ref 70–99)
Potassium: 3.5 mmol/L (ref 3.5–5.2)
Sodium: 137 mmol/L (ref 134–144)
eGFR: 110 mL/min/{1.73_m2} (ref >59)

## 2024-02-22 LAB — MICROALBUMIN / CREATININE URINE RATIO
Creatinine, Urine: 122.4 mg/dL
Microalb/Creat Ratio: 45 mg/g{creat} — ABNORMAL HIGH (ref 0–29)
Microalbumin, Urine: 54.7 ug/mL

## 2024-03-18 ENCOUNTER — Encounter: Payer: Self-pay | Admitting: *Deleted

## 2024-06-27 ENCOUNTER — Other Ambulatory Visit: Payer: Self-pay | Admitting: Student

## 2024-06-27 DIAGNOSIS — E119 Type 2 diabetes mellitus without complications: Secondary | ICD-10-CM

## 2024-07-03 ENCOUNTER — Ambulatory Visit

## 2024-07-03 ENCOUNTER — Ambulatory Visit (HOSPITAL_COMMUNITY)
Admission: RE | Admit: 2024-07-03 | Discharge: 2024-07-03 | Disposition: A | Discharge status: Home / Self Care | Source: Ambulatory Visit | Attending: Family Medicine | Admitting: Family Medicine

## 2024-07-03 ENCOUNTER — Ambulatory Visit
Admission: RE | Admit: 2024-07-03 | Discharge: 2024-07-03 | Disposition: A | Discharge status: Home / Self Care | Source: Ambulatory Visit | Attending: Family Medicine

## 2024-07-03 VITALS — BP 136/82 | HR 92 | Ht 59.0 in | Wt 125.6 lb

## 2024-07-03 DIAGNOSIS — R079 Chest pain, unspecified: Secondary | ICD-10-CM

## 2024-07-03 DIAGNOSIS — Z1231 Encounter for screening mammogram for malignant neoplasm of breast: Secondary | ICD-10-CM | POA: Diagnosis not present

## 2024-07-03 DIAGNOSIS — E119 Type 2 diabetes mellitus without complications: Secondary | ICD-10-CM

## 2024-07-03 DIAGNOSIS — Z0000 Encounter for general adult medical examination without abnormal findings: Secondary | ICD-10-CM

## 2024-07-03 DIAGNOSIS — R12 Heartburn: Secondary | ICD-10-CM

## 2024-07-03 LAB — POCT GLYCOSYLATED HEMOGLOBIN (HGB A1C): HbA1c, POC (controlled diabetic range): 6.7 % (ref 0.0–7.0)

## 2024-07-03 MED ORDER — PANTOPRAZOLE SODIUM 40 MG PO TBEC: 40.0000 mg | DELAYED_RELEASE_TABLET | Freq: Every day | ORAL | 3 refills | Status: DC

## 2024-07-03 MED ORDER — BISMUTH SUBSALICYLATE 262 MG PO CHEW: 524.0000 mg | CHEWABLE_TABLET | ORAL | 0 refills | Status: DC | PRN

## 2024-07-03 NOTE — Assessment & Plan Note (Signed)
 A1c 6.7 today. Good control of DM. Continue medications: januvia  25 mg every day, atorvastatin  40 mg every day, lisinopril  2.5 mg every day Foot exam was normal today Given number for ophthalmology referral, repeat referral placed today Plan for repeat A1c in 3 to 6 months given current good control

## 2024-07-03 NOTE — Assessment & Plan Note (Signed)
 EKG in office today with normal sinus rhythm and no ST elevations or concerning T wave depressions Symptoms likely due to uncontrolled gastroesophageal reflux disease.  No red flag symptoms today. Provided prescriptions for pantoprazole  40 milligrams once daily and Pepto-Bismol today.  We will see how her symptoms do with this.  She will follow-up with me in 2 weeks to see how her symptoms are controlled.

## 2024-07-03 NOTE — Progress Notes (Signed)
    SUBJECTIVE:   CHIEF COMPLAINT / HPI:   Diabetes Patient is tolerating medications well.  She has been compliant with her medication regimen. She does note blurry vision.  Referral in the past was never scheduled.  She has not seen an eye doctor. She has not recently had a foot exam, but she does not have any concerns about her feet.  No neuropathic symptoms.  Chest pain 2 weeks of central chest burning pain that is constant and associated with shortness of breath.  Pain is not altered by eating or exertion.  Pain does not radiate.  She tried zantac once daily for couple days without any benefit. Notes prior transient episodes of midsternal burning pain.  She does not think that these were associated with eating. No dysphagia, early systolic satiety, weight loss.  PERTINENT  PMH / PSH: Diabetes, hyperlipidemia  OBJECTIVE:   BP 136/82   Pulse 92   Ht 4' 11 (1.499 m)   Wt 125 lb 9.6 oz (57 kg)   SpO2 99%   BMI 25.37 kg/m    General: Well-appearing woman in no acute distress, here with her young daughter today CV: Regular rate and rhythm, no murmurs MSK: No reproducible pain with palpation of the costochondral joints Feet: Skin warm, no calluses, ulcers, lesions, no dystrophy of the nails, sensation intact to light touch bilaterally Respiratory: Lungs clear to auscultation, no increased work of breathing  EKG performed in office with normal sinus rhythm.  No ST elevations no T wave depressions.  ASSESSMENT/PLAN:   Assessment & Plan Type 2 diabetes mellitus without complication, without long-term current use of insulin (HCC) A1c 6.7 today. Good control of DM. Continue medications: januvia  25 mg every day, atorvastatin  40 mg every day, lisinopril  2.5 mg every day Foot exam was normal today Given number for ophthalmology referral, repeat referral placed today Plan for repeat A1c in 3 to 6 months given current good control Routine adult health maintenance Referral for  colonoscopy provided Last mammogram 11/2020 with recommendation for repeat screen in one year, mammogram referral placed today PAP NILM, HPV negative 02/15/2023, next due 02/2028 Chest pain, unspecified type Heartburn EKG in office today with normal sinus rhythm and no ST elevations or concerning T wave depressions Symptoms likely due to uncontrolled gastroesophageal reflux disease.  No red flag symptoms today. Provided prescriptions for pantoprazole  40 milligrams once daily and Pepto-Bismol today.  We will see how her symptoms do with this.  She will follow-up with me in 2 weeks to see how her symptoms are controlled.      Vidyuth Belsito Alena Morrison, MD [facility]

## 2024-07-03 NOTE — Patient Instructions (Addendum)
 It was wonderful to see you today.  Today we talked about:  Your chest burning. EKG that looks at the heart was normal. This may be related to heartburn/ GERD (gastroesophageal reflux disease). Let's try pepto bismol which you can take as needed up to 8 times in a day and pantoprazole  40 mg which you will take once a day to see if these help your symptoms and then come back in the next several weeks.   Diabetes well controlled! Good job! Keep on your current medications - no changes. For your eyes, I sent the referral here: [facility] Address: [address], [place], [place] [address] Phone: [phone] They should call you, but if you dso not hear from them in two weeks, you can call the number above.  I referred you for colonoscopy, they will call you to set up an appointment. Please give us  a call if you don't hear back in the next 2 weeks.  For your mammogram, please call the [phone] to schedule this.  Thank you for choosing [facility].   Please call [phone] with any questions about today's appointment.  Please arrive at least 15 minutes prior to your scheduled appointments.  If you need additional refills before your next appointment, please call your pharmacy first.   Deirdra Heumann Alena Morrison, MD  [facility]

## 2024-07-09 ENCOUNTER — Ambulatory Visit: Payer: Self-pay

## 2024-07-17 ENCOUNTER — Ambulatory Visit: Payer: Self-pay

## 2024-07-17 VITALS — BP 125/83 | HR 86 | Wt 123.6 lb

## 2024-07-17 DIAGNOSIS — Z23 Encounter for immunization: Secondary | ICD-10-CM | POA: Diagnosis not present

## 2024-07-17 DIAGNOSIS — K219 Gastro-esophageal reflux disease without esophagitis: Secondary | ICD-10-CM | POA: Diagnosis not present

## 2024-07-17 DIAGNOSIS — E119 Type 2 diabetes mellitus without complications: Secondary | ICD-10-CM | POA: Diagnosis not present

## 2024-07-17 MED ORDER — FLUTICASONE PROPIONATE 50 MCG/ACT NA SUSP: 1.0000 | Freq: Every day | NASAL | 0 refills | Status: DC

## 2024-07-17 NOTE — Assessment & Plan Note (Signed)
 Please new referral to ophthalmology clinic with [facility] per patient request as this is where her children go in the last place did not take her insurance.

## 2024-07-17 NOTE — Patient Instructions (Addendum)
 I think that your burning pain is likely from heartburn.  The sputum that you are coughing up could be from regurgitation from the heartburn or from congestion in your sinuses.  I recommend using Flonase  daily to see if this can help with your coughing up white sputum.  Also start taking the pantoprazole  (also called Protonix ) 40 mg twice a day now.  Lets see if this helps the rest of your burning chest pain.  You can take a woman's multivitamin or a prenatal vitamin every day if you would like.  Your mammogram was totally normal.  Caroleen!  Based on that, my recommendation is to get another screening mammogram in a year.   I have put in another referral to go to the [facility],  per your request.

## 2024-07-17 NOTE — Progress Notes (Signed)
    SUBJECTIVE:   CHIEF COMPLAINT / HPI: Central burning chest pain  Her central burning chest pain is improved with pantoprazole .  She does note several episodes of burning central chest pain when she is stressed or has to yell at her kids.  No chest tightness, palpitations, shortness of breath associated with those episodes.  She does not note excessive worry more than half the days for the last 6 months.  She notes that she may have a worry or two a couple times a week.  She does have some fatigue.  No irritability, sleep disturbance, restlessness.  No problems controlling her worries.  She also notes coughing up small amounts of white sputum in the mornings in the evenings that has been going on for months, that she forgot about at the last visit.  No dysphagia or globus sensations.  She has not been taking Flonase .  PERTINENT  PMH / PSH: Diabetes  OBJECTIVE:   BP 125/83   Pulse 86   Wt 123 lb 9.6 oz (56.1 kg)   SpO2 100%   BMI 24.96 kg/m    General: Well-appearing woman in no acute distress, clears her throat occasionally during her interview Respiratory: Breathing comfortably on room air CV: Regular rate  ASSESSMENT/PLAN:   Assessment & Plan Gastroesophageal reflux disease, unspecified whether esophagitis present Symptoms improved with pantoprazole  use.  Does still have occasional burning pain associated with stress; however she does not meet criteria for generalized anxiety disorder.  Also not likely panic attacks. Recommended trialing pantoprazole  40 mg twice daily for 2 weeks to see if this further improves her symptoms. I suspect her sputum production is due to postnasal drip with her coughing or from her GERD. Encouraged her to use Flonase  daily.  Prescription sent to pharmacy.  She will follow-up with me on November 20 for reassessment of her symptoms. Type 2 diabetes mellitus without complication, without long-term current use of insulin (HCC) Please new referral to  ophthalmology clinic with [facility] per patient request as this is where her children go in the last place did not take her insurance. Encounter for immunization Flu, hep B, pneumococcal, Tdap given today Given her history of type 2 diabetes she meets criteria for pneumococcal vaccine prior to age 4 Patient requested Tdap today     Carol Harrington Alena Morrison, MD [facility]

## 2024-08-13 ENCOUNTER — Other Ambulatory Visit: Payer: Self-pay

## 2024-08-28 ENCOUNTER — Other Ambulatory Visit: Payer: Self-pay | Admitting: Student

## 2024-08-28 ENCOUNTER — Ambulatory Visit

## 2024-08-28 VITALS — BP 108/62 | HR 100 | Ht 59.0 in | Wt 127.8 lb

## 2024-08-28 DIAGNOSIS — E119 Type 2 diabetes mellitus without complications: Secondary | ICD-10-CM

## 2024-08-28 DIAGNOSIS — R12 Heartburn: Secondary | ICD-10-CM

## 2024-08-28 DIAGNOSIS — K219 Gastro-esophageal reflux disease without esophagitis: Secondary | ICD-10-CM | POA: Diagnosis not present

## 2024-08-28 MED ORDER — PANTOPRAZOLE SODIUM 40 MG PO TBEC: 40.0000 mg | DELAYED_RELEASE_TABLET | Freq: Two times a day (BID) | ORAL | 1 refills | Status: DC

## 2024-08-28 NOTE — Progress Notes (Signed)
    SUBJECTIVE:   CHIEF COMPLAINT / HPI:   Patient reports some improvement in her central burning pain since increasing pantoprazole  to 40 mg twice daily.  She is still having some pain that is not temporally associated with food intake.  No dysphagia or unintentional weight loss.  She has not tried the Pepto-Bismol.  Her coughing up sputum removed, and she has used Flonase  daily.  PERTINENT  PMH / PSH: Type 2 diabetes  OBJECTIVE:   BP 108/62   Pulse 100   Ht 4' 11 (1.499 m)   Wt 127 lb 12.8 oz (58 kg)   SpO2 99%   BMI 25.81 kg/m   General: Well-appearing female in no acute distress HEENT: Oropharynx without erythema, no tonsillar adenopathy, uvula is midline, moist mucous membranes, no cobblestoning in the oropharynx or nasal mucosa  ASSESSMENT/PLAN:   Assessment & Plan Gastroesophageal reflux disease, unspecified whether esophagitis present Heartburn Shared decision making on pursuing H. pylori testing after 4-week PPI washout versus referral to GI for possible EGD/further evaluation.  Patient elected GI referral which was placed today. I refilled her PPI, pantoprazole  40 mg twice daily.   She will follow-up with me in January for A1c and diabetes management.  Treanna Dumler Alena Morrison, MD [facility]

## 2024-08-28 NOTE — Assessment & Plan Note (Signed)
 Shared decision making on pursuing H. pylori testing after 4-week PPI washout versus referral to GI for possible EGD/further evaluation.  Patient elected GI referral which was placed today. I refilled her PPI, pantoprazole  40 mg twice daily.

## 2024-08-28 NOTE — Patient Instructions (Signed)
 It was wonderful to see you today.   Today we talked about:  Seeing the GI/gastroenterology doctor about your heartburn. A referral was placed, they will call you to set up an appointment. Please give us  a call if you don't hear back in the next 2 weeks.  Your last pap smear was May 2024. Your next one should be performed in 2029.   In January, we can check on your A1c for your diabetes.   Thank you for choosing [facility].   Please call [phone] with any questions about today's appointment.  Please arrive at least 15 minutes prior to your scheduled appointments.   If you need additional refills before your next appointment, please call your pharmacy first.   Reynol Arnone Alena Morrison, MD  [facility]

## 2024-09-23 ENCOUNTER — Encounter: Payer: Self-pay | Admitting: Gastroenterology

## 2024-09-25 ENCOUNTER — Ambulatory Visit: Admitting: Family Medicine

## 2024-09-25 VITALS — BP 118/54 | HR 63 | Temp 99.0°F | Ht 59.0 in | Wt 126.4 lb

## 2024-09-25 DIAGNOSIS — J029 Acute pharyngitis, unspecified: Secondary | ICD-10-CM | POA: Diagnosis not present

## 2024-09-25 DIAGNOSIS — R051 Acute cough: Secondary | ICD-10-CM | POA: Diagnosis not present

## 2024-09-25 DIAGNOSIS — R809 Proteinuria, unspecified: Secondary | ICD-10-CM | POA: Diagnosis not present

## 2024-09-25 MED ORDER — BENZONATATE 100 MG PO CAPS: 100.0000 mg | ORAL_CAPSULE | Freq: Two times a day (BID) | ORAL | 0 refills | Status: DC | PRN

## 2024-09-25 MED ORDER — VALSARTAN 40 MG PO TABS: 40.0000 mg | ORAL_TABLET | Freq: Every day | ORAL | 3 refills | Status: AC

## 2024-09-25 NOTE — Assessment & Plan Note (Addendum)
 Stop Lisinopril . Start valsartan  as above.

## 2024-09-25 NOTE — Progress Notes (Signed)
° ° °  SUBJECTIVE:   CHIEF COMPLAINT / HPI: Cough/sore throat  Cough/sore throat - Ongoing for 2 weeks - Has had no shortness of breath - No fevers - Does have history of allergies - Has been taking Flonase  and Claritin regularly - Has also been taking Tylenol  without relief - Has not had any improvement - Has had some mild congestion - States she does not feel sick, no fatigue or myalgias - Is on lisinopril  - No nausea vomiting, diarrhea - Has not limited her walking or going up stairs   PERTINENT  PMH / PSH: T2DM, HLD, Chiari Malformation 1  OBJECTIVE:   BP (!) 118/54   Pulse 63   Temp 99 F (37.2 C)   Ht 4' 11 (1.499 m)   Wt 126 lb 6.4 oz (57.3 kg)   SpO2 99%   BMI 25.53 kg/m   Physical Exam General: NAD, well appearing Neuro: A&O HEENT: Pupils equal round and reactive, moist mucous membranes, mild posterior oropharyngeal erythema, no signs of tonsillar adenitis or exudates, bilateral nasal turbinates mildly inflamed, bilateral shotty anterior cervical lymphadenopathy Cardiovascular: RRR, no murmurs,  Respiratory: normal WOB on RA, CTAB, no wheezes, ronchi or rales Abdomen: soft, NTTP, no rebound or guarding Extremities: Moving all 4 extremities equally, no peripheral edema    ASSESSMENT/PLAN:   Assessment & Plan Sore throat Acute cough Unclear etiology, may be just postviral cough syndrome in the setting of chronic allergic rhinitis.  However, may have some link to lisinopril .  No obvious signs of atypical pneumonia. - stop Lisinopril  - start Valsartan  40mg  daily - Tessalon  perles BID prn - 2 week f/u - Continue Claritin daily, and the Flonase  daily - Follow-up 2 to 3 weeks - Would consider chest x-ray and/or neck imaging if continuing to have symptoms at that time Microalbuminuria Stop Lisinopril . Start valsartan  as above.   Return in about 2 weeks (around 10/09/2024).  Carol Provencal, MD, PGY-3 [facility] 2:37 PM 09/25/2024  [facility]

## 2024-09-25 NOTE — Patient Instructions (Addendum)
 It was great to see you! Thank you for allowing me to participate in your care!  Our plans for today:  Your cough may be related to a virus or your allergies, it is difficult to tell as you are also on a medicine that can cause a cough. - Stop taking your lisinopril  - Start taking valsartan  40 mg daily, I sent this to your pharmacy - I have sent cough medicine Tessalon  Perles to your pharmacy can take this as needed - Continue Claritin and Flonase  - Please schedule an appointment to see us  in 2 to 3 weeks to see how you are doing  Please arrive 15 minutes PRIOR to your next scheduled appointment time! If you do not, this affects OTHER patients' care.  Take care and seek immediate care sooner if you develop any concerns.   Ozell Provencal, MD, PGY-3 [facility] 2:06 PM 09/25/2024  [facility]

## 2024-09-27 ENCOUNTER — Other Ambulatory Visit: Payer: Self-pay

## 2024-09-27 DIAGNOSIS — R12 Heartburn: Secondary | ICD-10-CM

## 2024-10-16 ENCOUNTER — Ambulatory Visit: Payer: Self-pay

## 2024-10-16 VITALS — BP 118/81 | HR 92 | Wt 122.8 lb

## 2024-10-16 DIAGNOSIS — E1169 Type 2 diabetes mellitus with other specified complication: Secondary | ICD-10-CM | POA: Diagnosis not present

## 2024-10-16 DIAGNOSIS — E119 Type 2 diabetes mellitus without complications: Secondary | ICD-10-CM

## 2024-10-16 DIAGNOSIS — E785 Hyperlipidemia, unspecified: Secondary | ICD-10-CM

## 2024-10-16 LAB — POCT GLYCOSYLATED HEMOGLOBIN (HGB A1C): HbA1c, POC (controlled diabetic range): 7.1 % — AB (ref 0.0–7.0)

## 2024-10-16 MED ORDER — ATORVASTATIN CALCIUM 80 MG PO TABS: 80.0000 mg | ORAL_TABLET | Freq: Every day | ORAL | 3 refills | Status: AC

## 2024-10-16 NOTE — Assessment & Plan Note (Addendum)
 A1c today 7.1. Discussed augmenting medication regimen for dietary modifications given A1c so close to goal and recent holiday season that is now passed. Patient elected to work on dietary modifications with close follow up. Continue current medications: januvia  25 mg every day, valsartan  40 mg every day  Foot exam normal today. Ophthalmology for eye exam scheduled in April.  Last urine microalbumin 02/21/24, moderately increased at 45, on ARB now.   Follow up in 3 months for A1c check and urine microalbumin and sooner if other needs arise.

## 2024-10-16 NOTE — Progress Notes (Signed)
" ° ° °  SUBJECTIVE:   CHIEF COMPLAINT / HPI:   DM Last A1c 07/03/24 6.7 on januvia  25 mg every day, atorvastatin  40 mg every day, recently switched to valsartan  40 mg daily due to cough. Notes changes in diet over last couple months with the holidays.  Cough resolved. She is able to take her medications as prescribed daily.  Has not been seen by ophthalmology for diabetic eye exam, scheduled in April  No other concerns today. Doing well.  PERTINENT  PMH / PSH: DM, HLD  OBJECTIVE:   BP 118/81   Pulse 92   Wt 122 lb 12.8 oz (55.7 kg)   BMI 24.80 kg/m    General: well appearing female in NAD Resp: Breathing comfortably on room air.  Diabetic foot exam: Skin is intact and warm. No calluses or ulcers. No deformity is present. Strength testing 5/5 with plantarflexion, dorsiflexion, inversion, and eversion. Sensation intact with monofilament testing sub-great toe, fifth toe, 1st, 3rd, and 5th MTPJ and heel. 2+ DP and PT pulses.   ASSESSMENT/PLAN:   Assessment & Plan Type 2 diabetes mellitus without complication, without long-term current use of insulin (HCC) A1c today 7.1. Discussed augmenting medication regimen for dietary modifications given A1c so close to goal and recent holiday season that is now passed. Patient elected to work on dietary modifications with close follow up. Continue current medications: januvia  25 mg every day, valsartan  40 mg every day  Foot exam normal today. Ophthalmology for eye exam scheduled in April.  Last urine microalbumin 02/21/24, moderately increased at 45, on ARB now.   Follow up in 3 months for A1c check and urine microalbumin and sooner if other needs arise.  Hyperlipidemia associated with type 2 diabetes mellitus (HCC) Based on LDL 133 in January 2025, will increase atorvastatin  to 80 mg for better control. Plan to recheck lipid panel at 3 month follow up.      Delbert Vu Alena Morrison, MD [facility] "

## 2024-10-16 NOTE — Patient Instructions (Addendum)
 I increased your atorvastatin  to 80 mg to try to lower your LDL (bad cholesterol). Your A1c today was 7.1. Continue your current Januvia  and work on your diet (see attached for more info) to try to bring this below 7. We will recheck in 3 months to see how your diabetes and cholesterol are doing.   [address] [facility] [address] [place] [address], [place], [place] [address] Phone: [phone] They will call pt to schedule an appointment.  Best, Elio Art, MD

## 2024-10-16 NOTE — Assessment & Plan Note (Addendum)
 Based on LDL 133 in January 2025, will increase atorvastatin  to 80 mg for better control. Plan to recheck lipid panel at 3 month follow up.

## 2024-10-30 ENCOUNTER — Other Ambulatory Visit: Payer: Self-pay | Admitting: Gastroenterology

## 2024-10-30 ENCOUNTER — Encounter: Payer: Self-pay | Admitting: Gastroenterology

## 2024-10-30 ENCOUNTER — Ambulatory Visit: Admitting: Gastroenterology

## 2024-10-30 VITALS — BP 118/80 | HR 84 | Ht <= 58 in | Wt 123.1 lb

## 2024-10-30 DIAGNOSIS — K219 Gastro-esophageal reflux disease without esophagitis: Secondary | ICD-10-CM

## 2024-10-30 DIAGNOSIS — Z1211 Encounter for screening for malignant neoplasm of colon: Secondary | ICD-10-CM

## 2024-10-30 MED ORDER — NA SULFATE-K SULFATE-MG SULF 17.5-3.13-1.6 GM/177ML PO SOLN: 1.0000 | Freq: Once | ORAL | 0 refills | Status: AC

## 2024-10-30 NOTE — Progress Notes (Signed)
 "  Chief Complaint:GERD Primary GI Doctor: Dr. Charlanne  HPI:  Patient is a  44  year old female patient with past medical history of GERD and hypertension, who was referred to me by Alena Morrison, Reagan, MD on 08/28/24 for a evaluation of GERD .    Interval History Patient presents for evaluation of GERD.  Patient reports history of GERD and was having pyrosis for over a year. She was started on Pantoprazole  40 mg po once daily about a month ago and reports symptoms have improved, but she still has days where she takes a second dose in the evening.  She does admit to eating spicy food. Denies dysphagia. Patient denies nausea, vomiting, or weight loss.   Patient denies altered bowel habits, abdominal pain, or rectal bleeding.   Never had EGD/colon.   No alcohol use. Nonsmoker.  Surgical history:none  Patient's family history includes: no esophageal CA, no colon CA  Patient recently put on Lipitor and complains of insomnia and muscle pains in hands and feet. She has stopped it. Advised she tell her PCP.   Wt Readings from Last 3 Encounters:  10/30/24 123 lb 2 oz (55.8 kg)  10/16/24 122 lb 12.8 oz (55.7 kg)  09/25/24 126 lb 6.4 oz (57.3 kg)    Past Medical History:  Diagnosis Date   Breast complaint 10/18/2020   Patient brought up at new patient appt 10/18/2020. Onset within a couple days.    COVID-19 virus detected 03/18/2019   Positive March 15, 2019    Dizziness, nonspecific 10/18/2020   Encounter for initial prescription of Nexplanon  11/11/2019   GDM, class A2 02/12/2019   Current Diabetic Medications:  None  [x]  Aspirin  81 mg daily after 12 weeks (? A2/B GDM)  For A2/B GDM or higher classes of DM [x ] Diabetes Education and Testing Supplies [ ]  Nutrition Counsult [x]  Fetal ECHO after 20 weeks  [ ]  Eye exam for retina evaluation   Baseline and surveillance labs (pulled in from [facility], refresh links as needed)  Lab Results Component Value Date  CREATININE 0.7 06/26/200    Gestational diabetes    HLD (hyperlipidemia)    Hypertension 10/18/2020   Patient started on losartan  25 mg daily after delivering last child in 2021. Patient unsure if she was hypertensive outside of pregnancy.    IUD (intrauterine device) in place 08/11/2019   10/20  liletta    Paresthesia of right upper and lower extremity 01/26/2022    Past Surgical History:  Procedure Laterality Date   INSERTION OF IMPLANON  ROD  02/15/2023   NO PAST SURGERIES     REMOVAL OF IMPLANON  ROD  02/15/2023    Current Outpatient Medications  Medication Sig Dispense Refill   atorvastatin  (LIPITOR) 80 MG tablet Take 1 tablet (80 mg total) by mouth daily. 90 tablet 3   fluticasone  (FLONASE ) 50 MCG/ACT nasal spray SPRAY 1 SPRAY INTO BOTH NOSTRILS DAILY. 16 mL 2   JANUVIA  25 MG tablet TAKE 1 TABLET (25 MG TOTAL) BY MOUTH DAILY. 90 tablet 3   pantoprazole  (PROTONIX ) 40 MG tablet TAKE 1 TABLET BY MOUTH EVERY DAY 90 tablet 1   TRI-LUMA 0.01-4-0.05 % CREA Apply 1 Application topically at bedtime.     valsartan  (DIOVAN ) 40 MG tablet Take 1 tablet (40 mg total) by mouth at bedtime. 90 tablet 3   No current facility-administered medications for this visit.    Allergies as of 10/30/2024   (No Known Allergies)    Family History  Problem Relation  Age of Onset   Hypertension Mother    Hypertension Father    Stroke Father     Review of Systems:    Constitutional: No weight loss, fever, chills, weakness or fatigue HEENT: Eyes: No change in vision               Ears, Nose, Throat:  No change in hearing or congestion Skin: No rash or itching Cardiovascular: No chest pain, chest pressure or palpitations   Respiratory: No SOB or cough Gastrointestinal: See HPI and otherwise negative Genitourinary: No dysuria or change in urinary frequency Neurological: No headache, dizziness or syncope Musculoskeletal: No new muscle or joint pain Hematologic: No bleeding or bruising Psychiatric: No history of depression or  anxiety    Physical Exam:  Vital signs: BP 118/80 (BP Location: Right Arm, Patient Position: Sitting, Cuff Size: Normal)   Pulse 84   Ht 4' 10 (1.473 m) Comment: height measured without shoes  Wt 123 lb 2 oz (55.8 kg)   BMI 25.73 kg/m   Constitutional:   Pleasant  female appears to be in NAD, Well developed, Well nourished, alert and cooperative Eyes:   PEERL, EOMI. No icterus. Conjunctiva pink. Neck:  Supple Throat: Oral cavity and pharynx without inflammation, swelling or lesion.  Respiratory: Respirations even and unlabored. Lungs clear to auscultation bilaterally.   No wheezes, crackles, or rhonchi.  Cardiovascular: Normal S1, S2. Regular rate and rhythm. No peripheral edema, cyanosis or pallor.  Gastrointestinal:  Soft, nondistended, nontender. No rebound or guarding. Normal bowel sounds. No appreciable masses or hepatomegaly. Rectal:  Not performed.  Msk:  Symmetrical without gross deformities. Without edema, no deformity or joint abnormality.  Neurologic:  Alert and  oriented x4;  grossly normal neurologically.  Skin:   Dry and intact without significant lesions or rashes.  RELEVANT LABS AND IMAGING: CBC    Latest Ref Rng & Units 02/01/2023    5:35 PM 10/18/2020   10:56 AM 07/27/2019   12:35 AM  CBC  WBC 3.4 - 10.8 x10E3/uL 5.4  5.9  10.2   Hemoglobin 11.1 - 15.9 g/dL 86.2  86.6  85.7   Hematocrit 34.0 - 46.6 % 42.6  40.9  38.7   Platelets 150 - 450 x10E3/uL 225  179  170      CMP     Latest Ref Rng & Units 02/21/2024    3:44 PM 02/01/2023    5:35 PM 01/26/2022    2:23 PM  CMP  Glucose 70 - 99 mg/dL 828  773  837   BUN 6 - 24 mg/dL 10  11  10    Creatinine 0.57 - 1.00 mg/dL 9.32  9.27  9.40   Sodium 134 - 144 mmol/L 137  139  141   Potassium 3.5 - 5.2 mmol/L 3.5  3.5  3.7   Chloride 96 - 106 mmol/L 101  102  105   CO2 20 - 29 mmol/L 22  20  22    Calcium  8.7 - 10.2 mg/dL 9.3  9.2  9.0   Total Protein 6.0 - 8.5 g/dL  7.3  6.9   Total Bilirubin 0.0 - 1.2 mg/dL   0.3  0.3   Alkaline Phos 44 - 121 IU/L  70  63   AST 0 - 40 IU/L  17  17   ALT 0 - 32 IU/L  24  15      Lab Results  Component Value Date   TSH 1.260 01/26/2022     Assessment: Encounter  Diagnoses  Name Primary?   Gastroesophageal reflux disease, unspecified whether esophagitis present Yes   Special screening for malignant neoplasms, colon   44 year old female patient with history of uncontrolled GERD, recently placed on Pantoprazole  40mg  po daily once daily with breakthrough symptoms. Reinforced importance of reflux diet. Will go ahead and schedule upper GI endoscopy to evaluate and r/o esophagitis and or Barretts.  Patient due for colon screening surveillance, will schedule colonoscopy in [facility] with Dr.    Orvil: Recommend GERD diet, no late meals 3-4 hours before lying down  Continue Pantoprazole  40 mg po daily Schedule EGD in [facility] with Dr. Charlanne.  The risks and benefits of EGD with possible biopsies and esophageal dilation were discussed with the patient who agrees to proceed. Schedule for a colonoscopy in [facility] with Dr. Charlanne. The risks and benefits of colonoscopy with possible polypectomy / biopsies were discussed and the patient agrees to proceed.    Thank you for the courtesy of this consult. Please call me with any questions or concerns.   Nakyiah Kuck, FNP-C [facility] 10/30/2024, 10:59 AM  Cc: Alena Morrison, Reagan, MD  "

## 2024-10-30 NOTE — Patient Instructions (Addendum)
 GERD Recommend GERD diet, no late meals 3-4 hours before lying down  Continue Pantoprazole  40 mg po daily  You have been scheduled for an endoscopy and colonoscopy with Dr. Charlanne on 11-06-24. Please follow the written instructions given to you at your visit today.  If you use inhalers (even only as needed), please bring them with you on the day of your procedure.  DO NOT TAKE 7 DAYS PRIOR TO TEST- Trulicity (dulaglutide) Ozempic, Wegovy (semaglutide) Mounjaro, Zepbound (tirzepatide) Bydureon Bcise (exanatide extended release)  DO NOT TAKE 1 DAY PRIOR TO YOUR TEST Rybelsus (semaglutide) Adlyxin (lixisenatide) Victoza (liraglutide) Byetta (exanatide) ___________________________________________________________________________   Thank you for entrusting me with your care and for choosing [employer], Carol May, NP   _______________________________________________________  If your blood pressure at your visit was 140/90 or greater, please contact your primary care physician to follow up on this.  _______________________________________________________  If you are age 3 or older, your body mass index should be between 23-30. Your Body mass index is 25.73 kg/m. If this is out of the aforementioned range listed, please consider follow up with your Primary Care Provider.  If you are age 40 or younger, your body mass index should be between 19-25. Your Body mass index is 25.73 kg/m. If this is out of the aformentioned range listed, please consider follow up with your Primary Care Provider.   ________________________________________________________  The [facility] providers would like to encourage you to use MYCHART to communicate with providers for non-urgent requests or questions.  Due to long hold times on the telephone, sending your provider a message by [facility] [place] be a faster and more efficient way to get a response.  Please allow 48 business hours for a response.  Please  remember that this is for non-urgent requests.  _______________________________________________________  [facility] is using a team-based approach to care.  Your team is made up of your doctor and two to three APPS. Our APPS (Nurse Practitioners and Physician Assistants) work with your physician to ensure care continuity for you. They are fully qualified to address your health concerns and develop a treatment plan. They communicate directly with your gastroenterologist to care for you. Seeing the Advanced Practice Practitioners on your physician's team can help you by facilitating care more promptly, often allowing for earlier appointments, access to diagnostic testing, procedures, and other specialty referrals.   Due to recent changes in healthcare laws, you [place] see the results of your imaging and laboratory studies on MyChart before your provider has had a chance to review them.  We understand that in some cases there [place] be results that are confusing or concerning to you. Not all laboratory results come back in the same time frame and the provider [place] be waiting for multiple results in order to interpret others.  Please give us  48 hours in order for your provider to thoroughly review all the results before contacting the office for clarification of your results.

## 2024-11-06 ENCOUNTER — Ambulatory Visit: Admitting: Gastroenterology

## 2024-11-06 ENCOUNTER — Encounter: Payer: Self-pay | Admitting: Gastroenterology

## 2024-11-06 VITALS — BP 142/92 | HR 65 | Temp 98.2°F | Resp 22 | Ht <= 58 in | Wt 123.0 lb

## 2024-11-06 DIAGNOSIS — K640 First degree hemorrhoids: Secondary | ICD-10-CM | POA: Diagnosis not present

## 2024-11-06 DIAGNOSIS — K219 Gastro-esophageal reflux disease without esophagitis: Secondary | ICD-10-CM | POA: Diagnosis not present

## 2024-11-06 DIAGNOSIS — K2960 Other gastritis without bleeding: Secondary | ICD-10-CM | POA: Diagnosis not present

## 2024-11-06 DIAGNOSIS — Z1211 Encounter for screening for malignant neoplasm of colon: Secondary | ICD-10-CM

## 2024-11-06 MED ORDER — SODIUM CHLORIDE 0.9 % IV SOLN: 500.0000 mL | Freq: Once | INTRAVENOUS | Status: DC

## 2024-11-06 NOTE — Op Note (Signed)
 [facility] Patient Name: Carol [place] Procedure Date: 11/06/2024 3:07 PM MRN: [id number] Endoscopist: Lynnie Bring , MD, [id number] Age: 44 Referring MD:  Date of Birth: Apr 11, 1981 Gender: Female Account #: [id number] Procedure:                Upper GI endoscopy Indications:              Heartburn Medicines:                Monitored Anesthesia Care Procedure:                Pre-Anesthesia Assessment:                           - Prior to the procedure, a History and Physical                            was performed, and patient medications and                            allergies were reviewed. The patient's tolerance of                            previous anesthesia was also reviewed. The risks                            and benefits of the procedure and the sedation                            options and risks were discussed with the patient.                            All questions were answered, and informed consent                            was obtained. Prior Anticoagulants: The patient has                            taken no anticoagulant or antiplatelet agents. ASA                            Grade Assessment: II - A patient with mild systemic                            disease. After reviewing the risks and benefits,                            the patient was deemed in satisfactory condition to                            undergo the procedure.                           After obtaining informed consent, the endoscope was  passed under direct vision. Throughout the                            procedure, the patient's blood pressure, pulse, and                            oxygen saturations were monitored continuously. The                            Olympus Scope [id number] was introduced through the                            mouth, and advanced to the second part of duodenum.                            The upper GI endoscopy was accomplished  without                            difficulty. The patient tolerated the procedure                            well. Scope In: Scope Out: Findings:                 The examined esophagus was normal with well-defined                            Z-line at 35 cm, examined by NBI. Biopsies were                            taken with a cold forceps for histology.                           Localized mild inflammation characterized by                            congestion (edema) and erythema was found in the                            gastric antrum. Biopsies were taken with a cold                            forceps for histology.                           The examined duodenum was normal. Complications:            No immediate complications. Estimated Blood Loss:     Estimated blood loss: none. Impression:               - Normal esophagus. Biopsied.                           - Gastritis. Biopsied.                           -  Normal examined duodenum. Recommendation:           - Patient has a contact number available for                            emergencies. The signs and symptoms of potential                            delayed complications were discussed with the                            patient. Return to normal activities tomorrow.                            Written discharge instructions were provided to the                            patient.                           - Resume previous diet.                           - Continue present medications.                           - Await pathology results.                           - Follow an antireflux regimen.                           - The findings and recommendations were discussed                            with the patient's family. Lynnie Bring, MD 11/06/2024 3:14:49 PM This report has been signed electronically.

## 2024-11-06 NOTE — Progress Notes (Signed)
 Report given to PACU, vss

## 2024-11-06 NOTE — Op Note (Signed)
 [facility] Patient Name: Carol Harrington Procedure Date: 11/06/2024 3:06 PM MRN: [id number] Endoscopist: Lynnie Bring , MD, [id number] Age: 44 Referring MD:  Date of Birth: 11/23/1980 Gender: Female Account #: [id number] Procedure:                Colonoscopy Indications:              Screening for colorectal malignant neoplasm Medicines:                Monitored Anesthesia Care Procedure:                Pre-Anesthesia Assessment:                           - Prior to the procedure, a History and Physical                            was performed, and patient medications and                            allergies were reviewed. The patient's tolerance of                            previous anesthesia was also reviewed. The risks                            and benefits of the procedure and the sedation                            options and risks were discussed with the patient.                            All questions were answered, and informed consent                            was obtained. Prior Anticoagulants: The patient has                            taken no anticoagulant or antiplatelet agents. ASA                            Grade Assessment: II - A patient with mild systemic                            disease. After reviewing the risks and benefits,                            the patient was deemed in satisfactory condition to                            undergo the procedure.                           After obtaining informed consent, the colonoscope  was passed under direct vision. Throughout the                            procedure, the patient's blood pressure, pulse, and                            oxygen saturations were monitored continuously. The                            Olympus Scope PCF SN [id number] was introduced through                            the anus and advanced to the 2 cm into the ileum.                            The colonoscopy was  performed without difficulty.                            The patient tolerated the procedure well. The                            quality of the bowel preparation was good. The                            terminal ileum, ileocecal valve, appendiceal                            orifice, and rectum were photographed. Scope In: 3:16:51 PM Scope Out: 3:27:27 PM Scope Withdrawal Time: 0 hours 7 minutes 39 seconds  Total Procedure Duration: 0 hours 10 minutes 36 seconds  Findings:                 The colon (entire examined portion) appeared normal.                           Non-bleeding internal hemorrhoids were found during                            retroflexion. The hemorrhoids were small and Grade                            I (internal hemorrhoids that do not prolapse).                           The terminal ileum appeared normal.                           Retroflexion in the right colon was performed.                           The exam was otherwise without abnormality on                            direct and retroflexion views. Complications:  No immediate complications. Estimated Blood Loss:     Estimated blood loss: none. Impression:               - The entire examined colon is normal.                           - Minimal internal hemorrhoids.                           - The examined portion of the ileum was normal.                           - The examination was otherwise normal on direct                            and retroflexion views.                           - No specimens collected. Recommendation:           - Patient has a contact number available for                            emergencies. The signs and symptoms of potential                            delayed complications were discussed with the                            patient. Return to normal activities tomorrow.                            Written discharge instructions were provided to the                             patient.                           - Resume previous diet.                           - Continue present medications.                           - Repeat colonoscopy in 10 years for screening                            purposes. Earlier, if with any new problems or                            change in family history.                           - The findings and recommendations were discussed                            with the patient's  family. Lynnie Bring, MD 11/06/2024 3:30:47 PM This report has been signed electronically.

## 2024-11-06 NOTE — Progress Notes (Signed)
 1450 Simethicone  133 mg per 2 cc given with 10 cc of H20 per Dr Charlanne request. Robinul 0.1 mg IV given due large amount of secretions upon assessment.  MD made aware, vss

## 2024-11-06 NOTE — Progress Notes (Signed)
 Called to room to assist during endoscopic procedure.  Patient ID and intended procedure confirmed with present staff. Received instructions for my participation in the procedure from the performing physician.

## 2024-11-06 NOTE — Patient Instructions (Addendum)
 YOU HAD AN ENDOSCOPIC PROCEDURE TODAY AT THE [facility]:   Refer to the procedure report that was given to you for any specific questions about what was found during the examination.  If the procedure report does not answer your questions, please call your gastroenterologist to clarify.  If you requested that your care partner not be given the details of your procedure findings, then the procedure report has been included in a sealed envelope for you to review at your convenience later.  YOU SHOULD EXPECT: Some feelings of bloating in the abdomen. Passage of more gas than usual.  Walking can help get rid of the air that was put into your GI tract during the procedure and reduce the bloating. If you had a lower endoscopy (such as a colonoscopy or flexible sigmoidoscopy) you may notice spotting of blood in your stool or on the toilet paper. If you underwent a bowel prep for your procedure, you may not have a normal bowel movement for a few days.  Please Note:  You might notice some irritation and congestion in your nose or some drainage.  This is from the oxygen used during your procedure.  There is no need for concern and it should clear up in a day or so.  SYMPTOMS TO REPORT IMMEDIATELY:  Following lower endoscopy (colonoscopy or flexible sigmoidoscopy):  Excessive amounts of blood in the stool  Significant tenderness or worsening of abdominal pains  Swelling of the abdomen that is new, acute  Fever of 100F or higher  Following upper endoscopy (EGD)  Vomiting of blood or coffee ground material  New chest pain or pain under the shoulder blades  Painful or persistently difficult swallowing  New shortness of breath  Fever of 100F or higher  Black, tarry-looking stools  Resume previous diet Continue present medications Await pathology results Follow an antireflux regimen Repeat colonoscopy in 10 years Handout on hemorrhoids given   For urgent or emergent issues, a  gastroenterologist can be reached at any hour by calling [phone]. Do not use MyChart messaging for urgent concerns.    DIET:  We do recommend a small meal at first, but then you may proceed to your regular diet.  Drink plenty of fluids but you should avoid alcoholic beverages for 24 hours.  ACTIVITY:  You should plan to take it easy for the rest of today and you should NOT DRIVE or use heavy machinery until tomorrow (because of the sedation medicines used during the test).    FOLLOW UP: Our staff will call the number listed on your records the next business day following your procedure.  We will call around 7:15- 8:00 am to check on you and address any questions or concerns that you may have regarding the information given to you following your procedure. If we do not reach you, we will leave a message.     If any biopsies were taken you will be contacted by phone or by letter within the next 1-3 weeks.  Please call us  at [phone] if you have not heard about the biopsies in 3 weeks.    SIGNATURES/CONFIDENTIALITY: You and/or your care partner have signed paperwork which will be entered into your electronic medical record.  These signatures attest to the fact that that the information above on your After Visit Summary has been reviewed and is understood.  Full responsibility of the confidentiality of this discharge information lies with you and/or your care-partner.

## 2024-11-06 NOTE — Progress Notes (Signed)
 "   Chief Complaint:GERD Primary GI Doctor: Dr. Charlanne   HPI:  Patient is a  44  year old female patient with past medical history of GERD and hypertension, who was referred to me by Alena Morrison, Reagan, MD on 08/28/24 for a evaluation of GERD .     Interval History Patient presents for evaluation of GERD.   Patient reports history of GERD and was having pyrosis for over a year. She was started on Pantoprazole  40 mg po once daily about a month ago and reports symptoms have improved, but she still has days where she takes a second dose in the evening.  She does admit to eating spicy food. Denies dysphagia. Patient denies nausea, vomiting, or weight loss.    Patient denies altered bowel habits, abdominal pain, or rectal bleeding.    Never had EGD/colon.    No alcohol use. Nonsmoker.   Surgical history:none   Patient's family history includes: no esophageal CA, no colon CA   Patient recently put on Lipitor and complains of insomnia and muscle pains in hands and feet. She has stopped it. Advised she tell her PCP.       Wt Readings from Last 3 Encounters:  10/30/24 123 lb 2 oz (55.8 kg)  10/16/24 122 lb 12.8 oz (55.7 kg)  09/25/24 126 lb 6.4 oz (57.3 kg)        Past Medical History:  Diagnosis Date   Breast complaint 10/18/2020    Patient brought up at new patient appt 10/18/2020. Onset within a couple days.    COVID-19 virus detected 03/18/2019    Positive March 15, 2019    Dizziness, nonspecific 10/18/2020   Encounter for initial prescription of Nexplanon  11/11/2019   GDM, class A2 02/12/2019    Current Diabetic Medications:  None  [x]  Aspirin  81 mg daily after 12 weeks (? A2/B GDM)  For A2/B GDM or higher classes of DM [x ] Diabetes Education and Testing Supplies [ ]  Nutrition Counsult [x]  Fetal ECHO after 20 weeks  [ ]  Eye exam for retina evaluation   Baseline and surveillance labs (pulled in from [facility], refresh links as needed)  Lab Results Component      Value             Date            CREATININE            0.7            06/26/200   Gestational diabetes     HLD (hyperlipidemia)     Hypertension 10/18/2020    Patient started on losartan  25 mg daily after delivering last child in 2021. Patient unsure if she was hypertensive outside of pregnancy.    IUD (intrauterine device) in place 08/11/2019    10/20  liletta    Paresthesia of right upper and lower extremity 01/26/2022               Past Surgical History:  Procedure Laterality Date   INSERTION OF IMPLANON  ROD   02/15/2023   NO PAST SURGERIES       REMOVAL OF IMPLANON  ROD   02/15/2023                Current Outpatient Medications  Medication Sig Dispense Refill   atorvastatin  (LIPITOR) 80 MG tablet Take 1 tablet (80 mg total) by mouth daily. 90 tablet 3   fluticasone  (FLONASE ) 50 MCG/ACT nasal spray SPRAY 1 SPRAY INTO BOTH NOSTRILS DAILY. 16  mL 2   JANUVIA  25 MG tablet TAKE 1 TABLET (25 MG TOTAL) BY MOUTH DAILY. 90 tablet 3   pantoprazole  (PROTONIX ) 40 MG tablet TAKE 1 TABLET BY MOUTH EVERY DAY 90 tablet 1   TRI-LUMA 0.01-4-0.05 % CREA Apply 1 Application topically at bedtime.       valsartan  (DIOVAN ) 40 MG tablet Take 1 tablet (40 mg total) by mouth at bedtime. 90 tablet 3      No current facility-administered medications for this visit.           Allergies as of 10/30/2024   (No Known Allergies)           Family History  Problem Relation Age of Onset   Hypertension Mother     Hypertension Father     Stroke Father            Review of Systems:    Constitutional: No weight loss, fever, chills, weakness or fatigue HEENT: Eyes: No change in vision               Ears, Nose, Throat:  No change in hearing or congestion Skin: No rash or itching Cardiovascular: No chest pain, chest pressure or palpitations   Respiratory: No SOB or cough Gastrointestinal: See HPI and otherwise negative Genitourinary: No dysuria or change in urinary frequency Neurological: No headache, dizziness or  syncope Musculoskeletal: No new muscle or joint pain Hematologic: No bleeding or bruising Psychiatric: No history of depression or anxiety      Physical Exam:  Vital signs: BP 118/80 (BP Location: Right Arm, Patient Position: Sitting, Cuff Size: Normal)   Pulse 84   Ht 4' 10 (1.473 m) Comment: height measured without shoes  Wt 123 lb 2 oz (55.8 kg)   BMI 25.73 kg/m    Constitutional:   Pleasant  female appears to be in NAD, Well developed, Well nourished, alert and cooperative Eyes:   PEERL, EOMI. No icterus. Conjunctiva pink. Neck:  Supple Throat: Oral cavity and pharynx without inflammation, swelling or lesion.  Respiratory: Respirations even and unlabored. Lungs clear to auscultation bilaterally.   No wheezes, crackles, or rhonchi.  Cardiovascular: Normal S1, S2. Regular rate and rhythm. No peripheral edema, cyanosis or pallor.  Gastrointestinal:  Soft, nondistended, nontender. No rebound or guarding. Normal bowel sounds. No appreciable masses or hepatomegaly. Rectal:  Not performed.  Msk:  Symmetrical without gross deformities. Without edema, no deformity or joint abnormality.  Neurologic:  Alert and  oriented x4;  grossly normal neurologically.  Skin:   Dry and intact without significant lesions or rashes.   RELEVANT LABS AND IMAGING: CBC     Latest Ref Rng & Units 02/01/2023    5:35 PM 10/18/2020   10:56 AM 07/27/2019   12:35 AM  CBC  WBC 3.4 - 10.8 x10E3/uL 5.4  5.9  10.2   Hemoglobin 11.1 - 15.9 g/dL 86.2  86.6  85.7   Hematocrit 34.0 - 46.6 % 42.6  40.9  38.7   Platelets 150 - 450 x10E3/uL 225  179  170       CMP         Latest Ref Rng & Units 02/21/2024    3:44 PM 02/01/2023    5:35 PM 01/26/2022    2:23 PM  CMP  Glucose 70 - 99 mg/dL 828  773  837   BUN 6 - 24 mg/dL 10  11  10    Creatinine 0.57 - 1.00 mg/dL 9.32  9.27  9.40   Sodium  134 - 144 mmol/L 137  139  141   Potassium 3.5 - 5.2 mmol/L 3.5  3.5  3.7   Chloride 96 - 106 mmol/L 101  102  105   CO2 20 -  29 mmol/L 22  20  22    Calcium  8.7 - 10.2 mg/dL 9.3  9.2  9.0   Total Protein 6.0 - 8.5 g/dL   7.3  6.9   Total Bilirubin 0.0 - 1.2 mg/dL   0.3  0.3   Alkaline Phos 44 - 121 IU/L   70  63   AST 0 - 40 IU/L   17  17   ALT 0 - 32 IU/L   24  15       Recent Labs       Lab Results  Component Value Date    TSH 1.260 01/26/2022        Assessment:     Encounter Diagnoses  Name Primary?   Gastroesophageal reflux disease, unspecified whether esophagitis present Yes   Special screening for malignant neoplasms, colon    44 year old female patient with history of uncontrolled GERD, recently placed on Pantoprazole  40mg  po daily once daily with breakthrough symptoms. Reinforced importance of reflux diet. Will go ahead and schedule upper GI endoscopy to evaluate and r/o esophagitis and or Barretts.   Patient due for colon screening surveillance, will schedule colonoscopy in [facility] with Dr.      Orvil: Recommend GERD diet, no late meals 3-4 hours before lying down  Continue Pantoprazole  40 mg po daily Schedule EGD in [facility] with Dr. Charlanne.  The risks and benefits of EGD with possible biopsies and esophageal dilation were discussed with the patient who agrees to proceed. Schedule for a colonoscopy in [facility] with Dr. Charlanne. The risks and benefits of colonoscopy with possible polypectomy / biopsies were discussed and the patient agrees to proceed.      Thank you for the courtesy of this consult. Please call me with any questions or concerns.    Deanna May, FNP-C [facility]     Attending physician's note   I have taken history, reviewed the chart and examined the patient. I performed a substantive portion of this encounter, including complete performance of at least one of the key components, in conjunction with the APP. I agree with the Advanced Practitioner's note, impression and recommendations.   For EGD/colon   Anselm Charlanne, MD [facility] [phone]  "

## 2024-11-07 ENCOUNTER — Telehealth: Payer: Self-pay

## 2024-11-07 NOTE — Telephone Encounter (Signed)
 Follow up call to pt, no answer.

## 2024-11-09 ENCOUNTER — Other Ambulatory Visit: Payer: Self-pay

## 2024-11-12 LAB — SURGICAL PATHOLOGY
